# Patient Record
Sex: Male | Born: 1945 | Race: Black or African American | Hispanic: No | Smoking: Former smoker
Health system: Southern US, Community
[De-identification: ages and names within clinical notes are randomized; demographics above are authoritative.]

## PROBLEM LIST (undated history)

## (undated) DIAGNOSIS — G589 Mononeuropathy, unspecified: Secondary | ICD-10-CM

## (undated) DIAGNOSIS — I10 Essential (primary) hypertension: Secondary | ICD-10-CM

## (undated) DIAGNOSIS — M109 Gout, unspecified: Secondary | ICD-10-CM

## (undated) DIAGNOSIS — M1990 Unspecified osteoarthritis, unspecified site: Secondary | ICD-10-CM

## (undated) DIAGNOSIS — K5792 Diverticulitis of intestine, part unspecified, without perforation or abscess without bleeding: Secondary | ICD-10-CM

## (undated) DIAGNOSIS — I639 Cerebral infarction, unspecified: Secondary | ICD-10-CM

## (undated) HISTORY — PX: REPLACEMENT TOTAL KNEE: SUR1224

## (undated) HISTORY — PX: COLON SURGERY: SHX602

---

## 2016-04-08 ENCOUNTER — Encounter (HOSPITAL_BASED_OUTPATIENT_CLINIC_OR_DEPARTMENT_OTHER): Payer: Self-pay | Admitting: Emergency Medicine

## 2016-04-08 ENCOUNTER — Emergency Department (HOSPITAL_BASED_OUTPATIENT_CLINIC_OR_DEPARTMENT_OTHER)
Admission: EM | Admit: 2016-04-08 | Discharge: 2016-04-08 | Disposition: A | Discharge status: Home / Self Care | Payer: Medicare Other | Attending: Emergency Medicine | Admitting: Emergency Medicine

## 2016-04-08 ENCOUNTER — Emergency Department (HOSPITAL_BASED_OUTPATIENT_CLINIC_OR_DEPARTMENT_OTHER): Payer: Medicare Other

## 2016-04-08 DIAGNOSIS — Z87891 Personal history of nicotine dependence: Secondary | ICD-10-CM | POA: Insufficient documentation

## 2016-04-08 DIAGNOSIS — Y929 Unspecified place or not applicable: Secondary | ICD-10-CM | POA: Diagnosis not present

## 2016-04-08 DIAGNOSIS — I10 Essential (primary) hypertension: Secondary | ICD-10-CM | POA: Insufficient documentation

## 2016-04-08 DIAGNOSIS — Z79899 Other long term (current) drug therapy: Secondary | ICD-10-CM | POA: Insufficient documentation

## 2016-04-08 DIAGNOSIS — Y999 Unspecified external cause status: Secondary | ICD-10-CM | POA: Diagnosis not present

## 2016-04-08 DIAGNOSIS — W19XXXA Unspecified fall, initial encounter: Secondary | ICD-10-CM | POA: Insufficient documentation

## 2016-04-08 DIAGNOSIS — S52125A Nondisplaced fracture of head of left radius, initial encounter for closed fracture: Secondary | ICD-10-CM | POA: Insufficient documentation

## 2016-04-08 DIAGNOSIS — Y939 Activity, unspecified: Secondary | ICD-10-CM | POA: Insufficient documentation

## 2016-04-08 DIAGNOSIS — S6992XA Unspecified injury of left wrist, hand and finger(s), initial encounter: Secondary | ICD-10-CM | POA: Diagnosis present

## 2016-04-08 HISTORY — DX: Essential (primary) hypertension: I10

## 2016-04-08 HISTORY — DX: Mononeuropathy, unspecified: G58.9

## 2016-04-08 HISTORY — DX: Unspecified osteoarthritis, unspecified site: M19.90

## 2016-04-08 HISTORY — DX: Cerebral infarction, unspecified: I63.9

## 2016-04-08 HISTORY — DX: Diverticulitis of intestine, part unspecified, without perforation or abscess without bleeding: K57.92

## 2016-04-08 MED ORDER — HYDROCODONE-ACETAMINOPHEN 5-325 MG PO TABS: 1.0000 | ORAL_TABLET | Freq: Four times a day (QID) | ORAL | 0 refills | Status: DC | PRN

## 2016-04-08 MED ORDER — HYDROCODONE-ACETAMINOPHEN 5-325 MG PO TABS
1.0000 | ORAL_TABLET | Freq: Once | ORAL | Status: AC
  Administered 2016-04-08: 1 via ORAL
  Filled 2016-04-08: qty 1

## 2016-04-08 NOTE — ED Triage Notes (Signed)
Patient reports a fall over the weekend and is concerned his hand or wrist is now fractured. Patient has hx of stroke. Swelling noted to hand, family at bedside states the swelling is about the same as usual. Patient also c/o left shoulder pain.

## 2016-04-08 NOTE — ED Provider Notes (Signed)
[facility] Provider Note   CSN: [id number] Arrival date & time: 04/08/16  1553  By signing my name below, I, Emmanuella Mensah, attest that this documentation has been prepared under the direction and in the presence of BoeingChris Jasmeen Fritsch, PA-C. Electronically Signed: Angelene GiovanniEmmanuella Mensah, ED Scribe. 04/08/16. 5:38 PM.   History   Chief Complaint Chief Complaint  Patient presents with  . Fall    with left hand/wrist pain   HPI Comments: William Waller is a 70 y.o. male with a hx of hypertension and left side deficit s/p stroke who presents to the Emergency Department complaining of gradually worsening moderate pain to the dorsum of his left hand with swelling s/p fall that occurred 4 days ago. Pt reports associated left anterior shoulder pain, left elbow pain, and left foot swelling. He notes that his left hand has been swollen since his stroke and is unsure if the swelling has increased. No alleviating factors noted. Family member reports that pt fell but denies any LOC or head injuries. Pt has not tried any medications PTA. He has NKDA. He denies any fever, chills, nausea, vomiting, numbness/tingling, acute weakness since fall, or any other symptoms.   The history is provided by the patient. No language interpreter was used.    Past Medical History:  Diagnosis Date  . Arthritis   . Diverticulitis   . Hypertension   . Pinched nerve    in back  . Stroke [facility](HCC)    left side deficit    There are no active problems to display for this patient.   Past Surgical History:  Procedure Laterality Date  . COLON SURGERY    . REPLACEMENT TOTAL KNEE         Home Medications    Prior to Admission medications   Medication Sig Start Date End Date Taking? Authorizing Provider  atorvastatin (LIPITOR) 10 MG tablet Take by mouth daily.   Yes Historical Provider, MD  gabapentin (NEURONTIN) 100 MG capsule Take by mouth 3 (three) times daily.   Yes Historical Provider, MD  NIFEdipine  (PROCARDIA) 10 MG capsule Take by mouth.   Yes Historical Provider, MD    Family History History reviewed. No pertinent family history.  Social History Social History  Substance Use Topics  . Smoking status: Former Smoker    Types: Cigarettes  . Smokeless tobacco: Never Used  . Alcohol use No     Allergies   Patient has no known allergies.   Review of Systems Review of Systems  A complete 10 system review of systems was obtained and all systems are negative except as noted in the HPI and PMH.   Physical Exam Updated Vital Signs BP 131/84 (BP Location: Right Arm)   Pulse 71   Temp 98.1 F (36.7 C) (Oral)   Resp 18   Ht 5\' 9"  (1.753 m)   Wt 210 lb (95.3 kg)   SpO2 99%   BMI 31.01 kg/m   Physical Exam  Constitutional: He is oriented to person, place, and time. He appears well-developed and well-nourished.  HENT:  Head: Normocephalic and atraumatic.  Pulmonary/Chest: Effort normal.  Musculoskeletal: He exhibits tenderness.  Diffuse pain over dorsum of left hand Pain diffusely in elbow and over left lateral shoulder  ROM decreased due to his stroke; unable to test Good pulses   Neurological: He is alert and oriented to person, place, and time.  Skin: Skin is warm and dry.  Psychiatric: He has a normal mood and affect.  Nursing  note and vitals reviewed.    ED Treatments / Results  DIAGNOSTIC STUDIES: Oxygen Saturation is 99% on RA, normal by my interpretation.    COORDINATION OF CARE: 5:33 PM- Pt advised of plan for treatment and pt agrees. Pt will receive left hand, left elbow, left shoulder, and left foot x-rays for further evaluation.    Labs (all labs ordered are listed, but only abnormal results are displayed) Labs Reviewed - No data to display  EKG  EKG Interpretation None       Radiology No results found.  Procedures Procedures (including critical care time)  Medications Ordered in ED Medications - No data to display   Initial  Impression / Assessment and Plan / ED Course  Ebbie Ridgehris Aribelle Mccosh, PA-C has reviewed the triage vital signs and the nursing notes.  Pertinent labs & imaging results that were available during my care of the patient were reviewed by me and considered in my medical decision making (see chart for details).  Clinical Course      Patient with left shoulder, left elbow, and left hand pain. Presentation concerning for sprain. X-rays reviewed by me revealed no bony abnormality, dislocation, or fracture. Patient is neurovascularly intact distally and able to move left shoulder, left elbow, and left hand although states it is painful. . Discussed RICE and pain medication. Instructed patient to follow up with their primary care provider within 2-3 days to have eft shoulder, left elbow, and left hand reevaluated. Discussed strict return precautions to the ED. patient appears reliable and expressed understanding to the discharge instructions.    Final Clinical Impressions(s) / ED Diagnoses   Final diagnoses:  None    New Prescriptions New Prescriptions   No medications on file   I personally performed the services described in this documentation, which was scribed in my presence. The recorded information has been reviewed and is accurate.   Charlestine NightChristopher Carlous Olivares, PA-C 04/10/16 0158    Maia PlanJoshua G Long, MD 04/10/16 [phone]

## 2016-04-08 NOTE — Discharge Instructions (Signed)
Return here as needed.  Follow-up with the orthopedist provided.  Ice to the area that is painful

## 2016-07-17 ENCOUNTER — Other Ambulatory Visit: Payer: Self-pay | Admitting: *Deleted

## 2016-07-17 ENCOUNTER — Ambulatory Visit (INDEPENDENT_AMBULATORY_CARE_PROVIDER_SITE_OTHER): Payer: Medicare Other | Admitting: Podiatry

## 2016-07-17 ENCOUNTER — Encounter: Payer: Self-pay | Admitting: Podiatry

## 2016-07-17 VITALS — Ht 69.0 in | Wt 210.0 lb

## 2016-07-17 DIAGNOSIS — L600 Ingrowing nail: Secondary | ICD-10-CM | POA: Diagnosis not present

## 2016-07-17 DIAGNOSIS — B351 Tinea unguium: Secondary | ICD-10-CM

## 2016-07-17 DIAGNOSIS — M79671 Pain in right foot: Secondary | ICD-10-CM

## 2016-07-17 DIAGNOSIS — M79672 Pain in left foot: Secondary | ICD-10-CM

## 2016-07-17 NOTE — Progress Notes (Signed)
SUBJECTIVE: 71 y.o. year old male presents complaining of painful toe nails duration of few days. Patient is wheelchair bound and escorted by his sister. Positive history of Stroke in August 2017. Weak on left side. Pinch nerve in back duration of 4-5 month.   REVIEW OF SYSTEMS: A comprehensive review of systems was negative except for: Recent history of stroke with left side weakness, pinch nerve on back for 4-5 months.  OBJECTIVE: DERMATOLOGIC EXAMINATION: Thick dystrophic nails x 10. Positive of yellow grey fungal debris in all nails. Severely deformed nail with ingrown nail right hallux.  VASCULAR EXAMINATION OF LOWER LIMBS: Pedal pulses are not palpable. Pedal edema bilateral.   NEUROLOGIC EXAMINATION OF THE LOWER LIMBS: All epicritic and tactile sensations are compromized.  MUSCULOSKELETAL EXAMINATION: Positive for stiffness of lower foot and ankle joint bilateral L>R.  ASSESSMENT: Onychomycosis x 10. Ingrown right hallucal nail without open skin. Pain in both feet.  PLAN: Reviewed findings and available options. All nails debrided. May return in 3 months.

## 2016-07-17 NOTE — Patient Instructions (Signed)
Seen for hypertrophic nails. All nails debrided. Return in 3 months or as needed.  

## 2016-10-21 ENCOUNTER — Ambulatory Visit (INDEPENDENT_AMBULATORY_CARE_PROVIDER_SITE_OTHER): Payer: Medicare Other | Admitting: Podiatry

## 2016-10-21 DIAGNOSIS — L600 Ingrowing nail: Secondary | ICD-10-CM | POA: Diagnosis not present

## 2016-10-21 DIAGNOSIS — M79672 Pain in left foot: Secondary | ICD-10-CM

## 2016-10-21 DIAGNOSIS — M79671 Pain in right foot: Secondary | ICD-10-CM | POA: Diagnosis not present

## 2016-10-21 DIAGNOSIS — B351 Tinea unguium: Secondary | ICD-10-CM | POA: Diagnosis not present

## 2016-10-21 NOTE — Progress Notes (Signed)
SUBJECTIVE: 71 y.o. year old male presents complaining of painful toe nails duration of few days. Patient is wheelchair bound and escorted by his sister. Positive history of Stroke in August 2017. Weak on left side. Pinch nerve in back duration of 4-5 month.   REVIEW OF SYSTEMS: A comprehensive review of systems was negative except for: Recent history of stroke with left side weakness, pinch nerve on back for 4-5 months.  OBJECTIVE: DERMATOLOGIC EXAMINATION: Thick dystrophic nails x 10. Positive of yellow grey fungal debris in all nails. Severely deformed nail with ingrown nail right hallux.  VASCULAR EXAMINATION OF LOWER LIMBS: Pedal pulses are not palpable. Pedal edema bilateral.   NEUROLOGIC EXAMINATION OF THE LOWER LIMBS: All epicritic and tactile sensations are compromized.  MUSCULOSKELETAL EXAMINATION: Positive for stiffness of lower foot and ankle joint bilateral L>R.  ASSESSMENT: Onychomycosis x 10. Ingrown right hallucal nail without open skin. Pain in both feet.  PLAN: Reviewed findings and available options. All nails debrided. May return in 3 months.

## 2016-10-21 NOTE — Patient Instructions (Addendum)
Seen for hypertrophic nails. All nails debrided. Return in 3 months or as needed.  

## 2016-10-22 ENCOUNTER — Encounter: Payer: Self-pay | Admitting: Podiatry

## 2017-01-21 ENCOUNTER — Ambulatory Visit: Payer: Medicare Other | Admitting: Podiatry

## 2017-02-04 ENCOUNTER — Encounter: Payer: Self-pay | Admitting: Podiatry

## 2017-02-04 ENCOUNTER — Ambulatory Visit (INDEPENDENT_AMBULATORY_CARE_PROVIDER_SITE_OTHER): Payer: Medicare Other | Admitting: Podiatry

## 2017-02-04 DIAGNOSIS — B353 Tinea pedis: Secondary | ICD-10-CM

## 2017-02-04 DIAGNOSIS — B351 Tinea unguium: Secondary | ICD-10-CM

## 2017-02-04 MED ORDER — NYSTATIN 100000 UNIT/GM EX CREA: TOPICAL_CREAM | Freq: Two times a day (BID) | CUTANEOUS | 3 refills | Status: DC

## 2017-02-04 NOTE — Patient Instructions (Signed)
Seen for hypertrophic nails. All nails debrided. Noted of skin lesion from fungus. Nystatin cream prescribed with instruction. Use anti fungal powder during the day. Return in 3 months or as needed.

## 2017-02-04 NOTE — Progress Notes (Signed)
SUBJECTIVE: 71 y.o.year old malepresents requesting nails trimmed. He is wheelchair bound and escorted by his sister.   HPI: Positive history of Stroke in August 2017. Weak on left side and on brace in left lower limb. Pinch nerve in back duration of 4-5 month.  OBJECTIVE: DERMATOLOGIC EXAMINATION: Thick dystrophic nails x 10. Positive of yellow grey fungal debris in all nails. Dry erupting skin in 1st web space and dorsal aspect left foot.  VASCULAR EXAMINATION OF LOWER LIMBS: Pedal pulses are not palpable. Pedal edema bilateral.   NEUROLOGIC EXAMINATION OF THE LOWER LIMBS: All epicritic and tactile sensations are compromized.  MUSCULOSKELETAL EXAMINATION: Positive for stiffness of lower foot and ankle joint bilateral L>R.  ASSESSMENT: Onychomycosis x 10. Tinea pedis left foot. Pain in both feet.  PLAN: Reviewed findings and available options. Rx. Nystatin cream. Instructed to use anti fungal powder during the day. All nails debrided. May return in 3 months

## 2017-04-29 ENCOUNTER — Other Ambulatory Visit: Payer: Self-pay

## 2017-04-29 ENCOUNTER — Emergency Department (HOSPITAL_BASED_OUTPATIENT_CLINIC_OR_DEPARTMENT_OTHER): Payer: Medicare Other

## 2017-04-29 ENCOUNTER — Encounter (HOSPITAL_BASED_OUTPATIENT_CLINIC_OR_DEPARTMENT_OTHER): Payer: Self-pay

## 2017-04-29 ENCOUNTER — Inpatient Hospital Stay (HOSPITAL_BASED_OUTPATIENT_CLINIC_OR_DEPARTMENT_OTHER)
Admission: EM | Admit: 2017-04-29 | Discharge: 2017-05-01 | DRG: 176 | Disposition: A | Discharge status: Home Health | Payer: Medicare Other | Attending: Family Medicine | Admitting: Family Medicine

## 2017-04-29 DIAGNOSIS — R0600 Dyspnea, unspecified: Secondary | ICD-10-CM | POA: Diagnosis not present

## 2017-04-29 DIAGNOSIS — I2699 Other pulmonary embolism without acute cor pulmonale: Secondary | ICD-10-CM | POA: Diagnosis present

## 2017-04-29 DIAGNOSIS — Z6833 Body mass index (BMI) 33.0-33.9, adult: Secondary | ICD-10-CM | POA: Diagnosis not present

## 2017-04-29 DIAGNOSIS — Z993 Dependence on wheelchair: Secondary | ICD-10-CM

## 2017-04-29 DIAGNOSIS — E669 Obesity, unspecified: Secondary | ICD-10-CM | POA: Diagnosis present

## 2017-04-29 DIAGNOSIS — Z720 Tobacco use: Secondary | ICD-10-CM | POA: Diagnosis not present

## 2017-04-29 DIAGNOSIS — E039 Hypothyroidism, unspecified: Secondary | ICD-10-CM | POA: Diagnosis present

## 2017-04-29 DIAGNOSIS — M1990 Unspecified osteoarthritis, unspecified site: Secondary | ICD-10-CM | POA: Diagnosis present

## 2017-04-29 DIAGNOSIS — M109 Gout, unspecified: Secondary | ICD-10-CM | POA: Diagnosis present

## 2017-04-29 DIAGNOSIS — I69354 Hemiplegia and hemiparesis following cerebral infarction affecting left non-dominant side: Secondary | ICD-10-CM | POA: Diagnosis not present

## 2017-04-29 DIAGNOSIS — E119 Type 2 diabetes mellitus without complications: Secondary | ICD-10-CM

## 2017-04-29 DIAGNOSIS — E1165 Type 2 diabetes mellitus with hyperglycemia: Secondary | ICD-10-CM | POA: Diagnosis present

## 2017-04-29 DIAGNOSIS — R402414 Glasgow coma scale score 13-15, 24 hours or more after hospital admission: Secondary | ICD-10-CM | POA: Diagnosis not present

## 2017-04-29 DIAGNOSIS — R062 Wheezing: Secondary | ICD-10-CM

## 2017-04-29 DIAGNOSIS — D649 Anemia, unspecified: Secondary | ICD-10-CM | POA: Diagnosis present

## 2017-04-29 DIAGNOSIS — R778 Other specified abnormalities of plasma proteins: Secondary | ICD-10-CM | POA: Diagnosis present

## 2017-04-29 DIAGNOSIS — I517 Cardiomegaly: Secondary | ICD-10-CM | POA: Diagnosis present

## 2017-04-29 DIAGNOSIS — R7989 Other specified abnormal findings of blood chemistry: Secondary | ICD-10-CM | POA: Diagnosis present

## 2017-04-29 DIAGNOSIS — Z96659 Presence of unspecified artificial knee joint: Secondary | ICD-10-CM | POA: Diagnosis present

## 2017-04-29 DIAGNOSIS — R0602 Shortness of breath: Secondary | ICD-10-CM | POA: Diagnosis present

## 2017-04-29 DIAGNOSIS — J42 Unspecified chronic bronchitis: Secondary | ICD-10-CM | POA: Diagnosis present

## 2017-04-29 DIAGNOSIS — M791 Myalgia, unspecified site: Secondary | ICD-10-CM | POA: Diagnosis present

## 2017-04-29 DIAGNOSIS — Z79899 Other long term (current) drug therapy: Secondary | ICD-10-CM | POA: Diagnosis not present

## 2017-04-29 DIAGNOSIS — I6930 Unspecified sequelae of cerebral infarction: Secondary | ICD-10-CM | POA: Diagnosis not present

## 2017-04-29 DIAGNOSIS — Z791 Long term (current) use of non-steroidal anti-inflammatories (NSAID): Secondary | ICD-10-CM | POA: Diagnosis not present

## 2017-04-29 DIAGNOSIS — I119 Hypertensive heart disease without heart failure: Secondary | ICD-10-CM | POA: Diagnosis present

## 2017-04-29 DIAGNOSIS — R748 Abnormal levels of other serum enzymes: Secondary | ICD-10-CM | POA: Diagnosis not present

## 2017-04-29 DIAGNOSIS — J410 Simple chronic bronchitis: Secondary | ICD-10-CM | POA: Diagnosis not present

## 2017-04-29 DIAGNOSIS — E113559 Type 2 diabetes mellitus with stable proliferative diabetic retinopathy, unspecified eye: Secondary | ICD-10-CM | POA: Diagnosis not present

## 2017-04-29 DIAGNOSIS — I2510 Atherosclerotic heart disease of native coronary artery without angina pectoris: Secondary | ICD-10-CM | POA: Diagnosis present

## 2017-04-29 DIAGNOSIS — R000 Tachycardia, unspecified: Secondary | ICD-10-CM | POA: Diagnosis present

## 2017-04-29 HISTORY — DX: Gout, unspecified: M10.9

## 2017-04-29 LAB — CBC WITH DIFFERENTIAL/PLATELET
BASOS PCT: 0 %
Basophils Absolute: 0 10*3/uL (ref 0.0–0.1)
EOS ABS: 0.3 10*3/uL (ref 0.0–0.7)
EOS PCT: 3 %
HCT: 37.3 % — ABNORMAL LOW (ref 39.0–52.0)
Hemoglobin: 12.5 g/dL — ABNORMAL LOW (ref 13.0–17.0)
LYMPHS ABS: 2.2 10*3/uL (ref 0.7–4.0)
Lymphocytes Relative: 23 %
MCH: 29.4 pg (ref 26.0–34.0)
MCHC: 33.5 g/dL (ref 30.0–36.0)
MCV: 87.8 fL (ref 78.0–100.0)
MONO ABS: 0.9 10*3/uL (ref 0.1–1.0)
MONOS PCT: 10 %
Neutro Abs: 6.1 10*3/uL (ref 1.7–7.7)
Neutrophils Relative %: 64 %
PLATELETS: 207 10*3/uL (ref 150–400)
RBC: 4.25 MIL/uL (ref 4.22–5.81)
RDW: 14.2 % (ref 11.5–15.5)
WBC: 9.5 10*3/uL (ref 4.0–10.5)

## 2017-04-29 LAB — BASIC METABOLIC PANEL
Anion gap: 7 (ref 5–15)
BUN: 11 mg/dL (ref 6–20)
CALCIUM: 9.5 mg/dL (ref 8.9–10.3)
CHLORIDE: 101 mmol/L (ref 101–111)
CO2: 27 mmol/L (ref 22–32)
CREATININE: 1.12 mg/dL (ref 0.61–1.24)
Glucose, Bld: 193 mg/dL — ABNORMAL HIGH (ref 65–99)
Potassium: 3.7 mmol/L (ref 3.5–5.1)
SODIUM: 135 mmol/L (ref 135–145)

## 2017-04-29 LAB — TROPONIN I: TROPONIN I: 0.05 ng/mL — AB (ref <0.03)

## 2017-04-29 LAB — D-DIMER, QUANTITATIVE: D-Dimer, Quant: 1.2 ug/mL-FEU — ABNORMAL HIGH (ref 0.00–0.50)

## 2017-04-29 MED ORDER — LEVALBUTEROL HCL 0.63 MG/3ML IN NEBU
INHALATION_SOLUTION | RESPIRATORY_TRACT | Status: AC
  Administered 2017-04-29: 0.63 mg
  Filled 2017-04-29: qty 3

## 2017-04-29 MED ORDER — ACETAMINOPHEN 650 MG RE SUPP: 650.0000 mg | Freq: Four times a day (QID) | RECTAL | Status: DC | PRN

## 2017-04-29 MED ORDER — ONDANSETRON HCL 4 MG PO TABS: 4.0000 mg | ORAL_TABLET | Freq: Four times a day (QID) | ORAL | Status: DC | PRN

## 2017-04-29 MED ORDER — IOPAMIDOL (ISOVUE-370) INJECTION 76%
100.0000 mL | Freq: Once | INTRAVENOUS | Status: AC | PRN
  Administered 2017-04-29: 100 mL via INTRAVENOUS

## 2017-04-29 MED ORDER — ACETAMINOPHEN 325 MG PO TABS: 650.0000 mg | ORAL_TABLET | Freq: Four times a day (QID) | ORAL | Status: DC | PRN

## 2017-04-29 MED ORDER — HEPARIN BOLUS VIA INFUSION
5500.0000 [IU] | Freq: Once | INTRAVENOUS | Status: AC
  Administered 2017-04-29: 5500 [IU] via INTRAVENOUS

## 2017-04-29 MED ORDER — PREDNISONE 20 MG PO TABS
40.0000 mg | ORAL_TABLET | Freq: Once | ORAL | Status: AC
  Administered 2017-04-29: 40 mg via ORAL
  Filled 2017-04-29: qty 2

## 2017-04-29 MED ORDER — ALBUTEROL (5 MG/ML) CONTINUOUS INHALATION SOLN
INHALATION_SOLUTION | RESPIRATORY_TRACT | Status: AC
  Administered 2017-04-29: 2.5 mg
  Filled 2017-04-29: qty 0.5

## 2017-04-29 MED ORDER — IPRATROPIUM-ALBUTEROL 0.5-2.5 (3) MG/3ML IN SOLN
RESPIRATORY_TRACT | Status: AC
  Administered 2017-04-29: 3 mL
  Filled 2017-04-29: qty 3

## 2017-04-29 MED ORDER — ALBUTEROL (5 MG/ML) CONTINUOUS INHALATION SOLN
INHALATION_SOLUTION | RESPIRATORY_TRACT | Status: AC
Start: 2017-04-29 — End: 2017-04-29
  Administered 2017-04-29: 2.5 mg
  Filled 2017-04-29: qty 0.5

## 2017-04-29 MED ORDER — HEPARIN (PORCINE) IN NACL 100-0.45 UNIT/ML-% IJ SOLN
1600.0000 [IU]/h | INTRAMUSCULAR | Status: DC
  Administered 2017-04-29 – 2017-04-30 (×2): 1600 [IU]/h via INTRAVENOUS
  Filled 2017-04-29 (×2): qty 250

## 2017-04-29 MED ORDER — IPRATROPIUM BROMIDE 0.02 % IN SOLN
RESPIRATORY_TRACT | Status: AC
  Administered 2017-04-29: 0.5 mg via RESPIRATORY_TRACT
  Filled 2017-04-29: qty 2.5

## 2017-04-29 MED ORDER — LEVALBUTEROL HCL 0.63 MG/3ML IN NEBU: 0.6300 mg | INHALATION_SOLUTION | Freq: Four times a day (QID) | RESPIRATORY_TRACT | Status: DC | PRN

## 2017-04-29 MED ORDER — LEVALBUTEROL HCL 1.25 MG/0.5ML IN NEBU
1.2500 mg | INHALATION_SOLUTION | Freq: Three times a day (TID) | RESPIRATORY_TRACT | Status: DC
  Administered 2017-04-29 – 2017-05-01 (×6): 1.25 mg via RESPIRATORY_TRACT
  Filled 2017-04-29 (×6): qty 0.5

## 2017-04-29 MED ORDER — ALBUTEROL SULFATE (2.5 MG/3ML) 0.083% IN NEBU
INHALATION_SOLUTION | RESPIRATORY_TRACT | Status: AC
  Administered 2017-04-29: 2.5 mg
  Filled 2017-04-29: qty 3

## 2017-04-29 MED ORDER — IPRATROPIUM BROMIDE 0.02 % IN SOLN
0.5000 mg | Freq: Three times a day (TID) | RESPIRATORY_TRACT | Status: DC
  Administered 2017-04-29 – 2017-05-01 (×6): 0.5 mg via RESPIRATORY_TRACT
  Filled 2017-04-29 (×6): qty 2.5

## 2017-04-29 MED ORDER — ONDANSETRON HCL 4 MG/2ML IJ SOLN: 4.0000 mg | Freq: Four times a day (QID) | INTRAMUSCULAR | Status: DC | PRN

## 2017-04-29 NOTE — Progress Notes (Signed)
ANTICOAGULATION CONSULT NOTE - Initial Consult  Pharmacy Consult for heparin Indication: pulmonary embolus  No Known Allergies  Patient Measurements: Height: 5\' 9"  (175.3 cm) Weight: 225 lb (102.1 kg) IBW/kg (Calculated) : 70.7 Heparin Dosing Weight: 92.5 kg  Assessment: 71 yo M presents on 12/12 with flu like symptoms. Found to have PE. Pharmacy consulted for heparin. Hgb 12.5, plts wnl.  Goal of Therapy:  Heparin level 0.3-0.7 units/ml Monitor platelets by anticoagulation protocol: Yes   Plan:  Give heparin 5,500 unit bolus Start heparin gtt at 1,600 units/hr Monitor daily heparin level, CBC, s/s of bleed  William Waller, PharmD, BCPS Clinical Pharmacist Pager [phone] 04/29/2017 4:14 PM

## 2017-04-29 NOTE — ED Notes (Addendum)
Date and time results received: 04/29/17 14:56 (use smartphrase ".now" to insert current time)  Test: trp Critical Value: 0.05  Name of Provider Notified: Gunadasa Orders Received? Or Actions Taken?: no orders given

## 2017-04-29 NOTE — ED Triage Notes (Signed)
C/o flu like sx x 3 days-NAD-presents to triage in w/c

## 2017-04-29 NOTE — ED Provider Notes (Signed)
[facility] EMERGENCY DEPARTMENT Provider Note   CSN: [id number] Arrival date & time: 04/29/17  1304     History   Chief Complaint Chief Complaint  Patient presents with  . Cough    HPI William Waller is a 71 y.o. male.  HPI 71 yo male with PMH of HTN, hx stroke with left sided deficits, diverticulosis, gout who presents with 3-4 days of chest congestion, wheezing, and cough with productive white mucous. No history of asthma or COPD but reports of tobacco use for the past 51 years; quit 2 years ago. His sister reports he has never had a episode of wheezing in the past. Reports of slight shortness of breath and constant central chest tightness. Denies current chest tightness.  Uncertain of aggravating factors but does report that the chest tightness is worse at night. No diaphoresis or nausea. Reports of chronic orthopnea that has not worsened. Has chronic lower extremity edema with left greater than right which is chronic. The left lower extremity swelling has been greater since his stroke two years ago. Family reports he ambulates short distances with a cane but needs wheelchair for longer distances. Reports that the breathing treatment received in the ED only slightly helped.   Past Medical History:  Diagnosis Date  . Arthritis   . Diverticulitis   . Gout   . Hypertension   . Pinched nerve    in back  . Stroke [facility](HCC)    left side deficit    There are no active problems to display for this patient.   Past Surgical History:  Procedure Laterality Date  . COLON SURGERY    . REPLACEMENT TOTAL KNEE         Home Medications    Prior to Admission medications   Medication Sig Start Date End Date Taking? Authorizing Provider  Levothyroxine Sodium (SYNTHROID PO) Take by mouth.   Yes [provider]  acetaminophen (TYLENOL) 325 MG tablet Take 650 mg by mouth. 02/04/16   [provider]  allopurinol (ZYLOPRIM) 100 MG tablet  06/11/16   [provider]  colchicine 0.6 MG tablet Take 0.6 mg by mouth. 09/02/10   [provider]  gabapentin (NEURONTIN) 100 MG capsule TK 1 TO 3 CS PO HS PRF LEG PAIN 10/09/15   [provider]  hydrOXYzine (ATARAX/VISTARIL) 25 MG tablet  07/11/16   [provider]  latanoprost (XALATAN) 0.005 % ophthalmic solution  06/11/16   [provider]  losartan (COZAAR) 50 MG tablet TK 1 T PO  DAILY 09/10/15   [provider]  meloxicam (MOBIC) 7.5 MG tablet  06/27/15   [provider]  nystatin cream (MYCOSTATIN) Apply topically 2 (two) times daily. 02/04/17   Sheard, Myeong O, DPM  pantoprazole (PROTONIX) 40 MG tablet  02/25/16   [provider]  polyethylene glycol powder (GLYCOLAX/MIRALAX) powder  02/25/16   [provider]  predniSONE (STERAPRED UNI-PAK 21 TAB) 10 MG (21) TBPK tablet TK UTD 10/09/15   [provider]  tamsulosin (FLOMAX) 0.4 MG CAPS capsule  06/20/16   [provider]  traMADol (ULTRAM) 50 MG tablet TK 1 T PO  Q 6 HOURS PRN P 06/07/15   [provider]  Travoprost, BAK Free, (TRAVATAN Z) 0.004 % SOLN ophthalmic solution Place 1 drop into both eyes nightly. 03/03/16   [provider]  traZODone (DESYREL) 100 MG tablet  02/25/16   [provider]  Triamcinolone Acetonide 0.025 % LOTN  06/13/16  [provider]    Family History No family history on file.  Social History Social History   Tobacco Use  . Smoking status: Former Smoker    Types: Cigarettes  . Smokeless tobacco: Never Used  Substance Use Topics  . Alcohol use: No  . Drug use: No     Allergies   Patient has no known allergies.   Review of Systems Review of Systems  Constitutional: Positive for chills. Negative for fever.  HENT: Positive for sore throat. Negative for postnasal drip, rhinorrhea, sinus pressure and trouble swallowing.   Respiratory: Positive for cough, chest tightness, shortness of  breath and wheezing.   Cardiovascular: Positive for leg swelling. Negative for palpitations.  Gastrointestinal: Negative for abdominal pain, constipation, diarrhea, nausea and vomiting.     Physical Exam Updated Vital Signs BP (!) 155/84 (BP Location: Left Arm)   Pulse (!) 108   Temp 98.5 F (36.9 C) (Oral)   Resp 20   Ht 5\' 9"  (1.753 m)   Wt 102.1 kg (225 lb)   SpO2 98%   BMI 33.23 kg/m   Physical Exam  Constitutional: He is oriented to person, place, and time. He appears well-developed and well-nourished. No distress.  HENT:  Mouth/Throat: Oropharynx is clear and moist.  Eyes: Conjunctivae are normal. Pupils are equal, round, and reactive to light. Right eye exhibits no discharge. Left eye exhibits no discharge.  Neck: Neck supple. No JVD present.  Cardiovascular: Normal rate and regular rhythm.  No murmur heard. Pulmonary/Chest: He has wheezes. He has no rales.  Tachypnea noted. Mild increased work of breathing.   Musculoskeletal: He exhibits no edema (left lower extremity pitting edema).  Neurological: He is alert and oriented to person, place, and time. No cranial nerve deficit.  Chronic left lower extremity weakness.   Skin: Skin is warm and dry. Capillary refill takes less than 2 seconds. He is not diaphoretic.  Psychiatric: He has a normal mood and affect. His behavior is normal. Judgment and thought content normal.     ED Treatments / Results  Labs (all labs ordered are listed, but only abnormal results are displayed) Labs Reviewed  CBC WITH DIFFERENTIAL/PLATELET - Abnormal; Notable for the following components:      Result Value   Hemoglobin 12.5 (*)    HCT 37.3 (*)    All other components within normal limits  BASIC METABOLIC PANEL - Abnormal; Notable for the following components:   Glucose, Bld 193 (*)    All other components within normal limits  TROPONIN I - Abnormal; Notable for the following components:   Troponin I 0.05 (*)    All other components  within normal limits  D-DIMER, QUANTITATIVE (NOT AT [facility]) - Abnormal; Notable for the following components:   D-Dimer, Quant 1.20 (*)    All other components within normal limits  HEPARIN LEVEL (UNFRACTIONATED)    EKG  EKG Interpretation  Date/Time:  Wednesday April 29 2017 14:05:08 EST Ventricular Rate:  110 PR Interval:    QRS Duration: 110 QT Interval:  359 QTC Calculation: 486 R Axis:   -25 Text Interpretation:  Sinus tachycardia Paired ventricular premature complexes Aberrant conduction of SV complex(es) Borderline left axis deviation Abnormal R-wave progression, early transition Nonspecific repolarization abnormalities Borderline prolonged QT interval Baseline wander in lead(s) V2 sinus with frequent pacs Confirmed by Benjiman CorePickering, Nathan [phone]([id number]) on 04/29/2017 3:23:54 PM       Radiology Dg Chest 2 View  Result Date: 04/29/2017 CLINICAL DATA:  Cough, wheeze, and  shortness of breath EXAM: CHEST  2 VIEW COMPARISON:  None. FINDINGS: Elevated right diaphragm with eventration appearance in the lateral projection. Borderline heart size. Aortic tortuosity. There is no edema, consolidation, effusion, or pneumothorax. No acute osseous finding. IMPRESSION: 1. No acute finding. 2. Elevated right diaphragm that is likely from eventration. Electronically Signed   By: Marnee Spring M.D.   On: 04/29/2017 13:48   Ct Angio Chest Pe W And/or Wo Contrast  Result Date: 04/29/2017 CLINICAL DATA:  Shortness of breath. Flu-like symptoms for 3 days. Positive D-dimer. Unable to lift arm. EXAM: CT ANGIOGRAPHY CHEST WITH CONTRAST TECHNIQUE: Multidetector CT imaging of the chest was performed using the standard protocol during bolus administration of intravenous contrast. Multiplanar CT image reconstructions and MIPs were obtained to evaluate the vascular anatomy. CONTRAST:  ISOVUE-370 IOPAMIDOL (ISOVUE-370) INJECTION 76% COMPARISON:  Chest radiograph earlier today. FINDINGS: Cardiovascular:  Cardiomegaly. Three-vessel coronary artery calcification. Calcification of the thoracic aorta and its major branches, without visible dissection. There is satisfactory opacification of the pulmonary arteries to the segmental level. There is no central or proximal pulmonary embolus. Visualization of the subsegmental pulmonary arterial branches is less optimal due to respiratory motion and positioning. However there is concern for a RIGHT posterior segment lower lobe subsegmental pulmonary embolus as seen on series 10, image 143. Mediastinum/Nodes: No enlarged mediastinal, hilar, or axillary lymph nodes. Thyroid gland, trachea, and esophagus demonstrate no significant findings. Lungs/Pleura: Lungs are clear. No pleural effusion or pneumothorax. Upper Abdomen: RIGHT hemidiaphragm elevation. Musculoskeletal: No chest wall abnormality. No acute or significant osseous findings. Review of the MIP images confirms the above findings. IMPRESSION: No central or segmental pulmonary embolus. However, there is concern for a RIGHT posterior segment, lower lobe subsegmental pulmonary embolus. See discussion above. Cardiomegaly with 3 vessel coronary artery calcification. Aortic Atherosclerosis (ICD10-I70.0). Electronically Signed   By: Elsie Stain M.D.   On: 04/29/2017 15:55    Procedures Procedures (including critical care time)  Medications Ordered in ED Medications  heparin ADULT infusion 100 units/mL (25000 units/258mL sodium chloride 0.45%) (not administered)  albuterol (PROVENTIL, VENTOLIN) (5 MG/ML) 0.5% continuous inhalation solution (2.5 mg  Given 04/29/17 1329)  ipratropium-albuterol (DUONEB) 0.5-2.5 (3) MG/3ML nebulizer solution (3 mLs  Given 04/29/17 1329)  albuterol (PROVENTIL, VENTOLIN) (5 MG/ML) 0.5% continuous inhalation solution (2.5 mg  Given 04/29/17 1406)  albuterol (PROVENTIL) (2.5 MG/3ML) 0.083% nebulizer solution (2.5 mg  Given 04/29/17 1406)  iopamidol (ISOVUE-370) 76 % injection 100 mL (100  mLs Intravenous Contrast Given 04/29/17 1530)  predniSONE (DELTASONE) tablet 40 mg (40 mg Oral Given 04/29/17 1616)  heparin bolus via infusion 5,500 Units (5,500 Units Intravenous Bolus from Bag 04/29/17 1636)  levalbuterol (XOPENEX) 0.63 MG/3ML nebulizer solution (0.63 mg  Given 04/29/17 1618)  ipratropium (ATROVENT) 0.02 % nebulizer solution (0.5 mg  Given 04/29/17 1618)     Initial Impression / Assessment and Plan / ED Course  I have reviewed the triage vital signs and the nursing notes.  Pertinent labs & imaging results that were available during my care of the patient were reviewed by me and considered in my medical decision making (see chart for details).  71 yo male with PMH of HTN, hx stroke with left sided deficits, diverticulosis, gout who presents with 3-4 days of chest congestion, wheezing, cough with productive white mucous, central chest tightness. Received albuterol and atrovent while in triage. Initial vitals are stable but with tachypnea with respiratory rate to 24 but normal oxygen saturation on room air. CXR without  infiltrate. Obtained CBC, BMP, EKG, troponin I, and d-dimer. D-dimer obtained as he does have left lower extremity edema although family reports this is chronic and does use wheelchair for long distances.   Repeat duoneb around 2 pm given.   CBC with no leukocytosis. BMP unremarkable. EKG with normal sinus with PAS and nonspecific repolarization abnormalities. Troponin I is slightly elevated to 0.05. D-dimer elevated to 1.20.  He is intermittently tachycardic to one teens then self resolves to low 100s. This is after getting albuterol treatments. Will obtain CTA chest to evaluate for PE in the setting of elevated d dimer. CTA chest with "concern for a RIGHT posterior segment, lower lobe subsegmental pulmonary embolus" but reports less optimal visualization of the subsegmental branches due to respiratory motion and positioning. Will start on IV heparin.  Prednisone  40mg  given as well. He has diffuse wheezing on exam. No prior diagnosis of asthma or COPD. He may have undiagnosed COPD with flare. Repeat breathing treatment.   Consult hospitalist for further inpatient management. Signed out to ED provider Dr. Donnald Garre.    Final Clinical Impressions(s) / ED Diagnoses   Final diagnoses:  Shortness of breath  Wheezing    ED Discharge Orders    None       Palma Holter, MD 04/29/17 1641    Benjiman Core, MD 04/30/17 [phone]

## 2017-04-29 NOTE — Progress Notes (Addendum)
[facility]  Sob and Chest Tightness------ possible Pulm Emolism, Bronchitis-  CTA chest there is concern for a RIGHT posterior segment, lower lobe subsegmental pulmonary embolus. Troponin is elevated, echo pending  On iv Heparin- Tele bed requested

## 2017-04-30 ENCOUNTER — Inpatient Hospital Stay (HOSPITAL_COMMUNITY): Payer: Medicare Other

## 2017-04-30 DIAGNOSIS — R748 Abnormal levels of other serum enzymes: Secondary | ICD-10-CM

## 2017-04-30 DIAGNOSIS — R0600 Dyspnea, unspecified: Secondary | ICD-10-CM

## 2017-04-30 DIAGNOSIS — R7989 Other specified abnormal findings of blood chemistry: Secondary | ICD-10-CM

## 2017-04-30 DIAGNOSIS — I2699 Other pulmonary embolism without acute cor pulmonale: Principal | ICD-10-CM

## 2017-04-30 DIAGNOSIS — R778 Other specified abnormalities of plasma proteins: Secondary | ICD-10-CM | POA: Diagnosis present

## 2017-04-30 DIAGNOSIS — E039 Hypothyroidism, unspecified: Secondary | ICD-10-CM

## 2017-04-30 DIAGNOSIS — R0602 Shortness of breath: Secondary | ICD-10-CM

## 2017-04-30 DIAGNOSIS — R062 Wheezing: Secondary | ICD-10-CM

## 2017-04-30 DIAGNOSIS — D649 Anemia, unspecified: Secondary | ICD-10-CM | POA: Diagnosis present

## 2017-04-30 LAB — CBC
HEMATOCRIT: 38.8 % — AB (ref 39.0–52.0)
Hemoglobin: 12.7 g/dL — ABNORMAL LOW (ref 13.0–17.0)
MCH: 28.5 pg (ref 26.0–34.0)
MCHC: 32.7 g/dL (ref 30.0–36.0)
MCV: 87 fL (ref 78.0–100.0)
PLATELETS: 233 10*3/uL (ref 150–400)
RBC: 4.46 MIL/uL (ref 4.22–5.81)
RDW: 13.9 % (ref 11.5–15.5)
WBC: 10.7 10*3/uL — AB (ref 4.0–10.5)

## 2017-04-30 LAB — HEPARIN LEVEL (UNFRACTIONATED)
HEPARIN UNFRACTIONATED: 0.9 [IU]/mL — AB (ref 0.30–0.70)
HEPARIN UNFRACTIONATED: 0.12 [IU]/mL — AB (ref 0.30–0.70)

## 2017-04-30 LAB — HEMOGLOBIN A1C
Hgb A1c MFr Bld: 7.3 % — ABNORMAL HIGH (ref 4.8–5.6)
Mean Plasma Glucose: 162.81 mg/dL

## 2017-04-30 LAB — ECHOCARDIOGRAM COMPLETE
HEIGHTINCHES: 69 in
WEIGHTICAEL: 3608.49 [oz_av]

## 2017-04-30 LAB — TROPONIN I
TROPONIN I: 0.05 ng/mL — AB (ref <0.03)
TROPONIN I: 0.03 ng/mL — AB (ref <0.03)
Troponin I: 0.04 ng/mL (ref <0.03)

## 2017-04-30 LAB — GLUCOSE, CAPILLARY
GLUCOSE-CAPILLARY: 199 mg/dL — AB (ref 65–99)
Glucose-Capillary: 155 mg/dL — ABNORMAL HIGH (ref 65–99)
Glucose-Capillary: 194 mg/dL — ABNORMAL HIGH (ref 65–99)

## 2017-04-30 LAB — TSH: TSH: 1.408 u[IU]/mL (ref 0.350–4.500)

## 2017-04-30 MED ORDER — GABAPENTIN 100 MG PO CAPS
100.0000 mg | ORAL_CAPSULE | Freq: Every day | ORAL | Status: DC
  Administered 2017-04-30 (×2): 100 mg via ORAL
  Filled 2017-04-30 (×2): qty 1

## 2017-04-30 MED ORDER — ALLOPURINOL 100 MG PO TABS
100.0000 mg | ORAL_TABLET | Freq: Every day | ORAL | Status: DC
  Administered 2017-04-30 – 2017-05-01 (×2): 100 mg via ORAL
  Filled 2017-04-30 (×2): qty 1

## 2017-04-30 MED ORDER — INSULIN ASPART 100 UNIT/ML ~~LOC~~ SOLN: 0.0000 [IU] | Freq: Every day | SUBCUTANEOUS | Status: DC

## 2017-04-30 MED ORDER — COLCHICINE 0.6 MG PO TABS
0.6000 mg | ORAL_TABLET | Freq: Every day | ORAL | Status: DC
  Administered 2017-04-30 – 2017-05-01 (×2): 0.6 mg via ORAL
  Filled 2017-04-30 (×2): qty 1

## 2017-04-30 MED ORDER — CLONIDINE HCL 0.1 MG PO TABS
0.1000 mg | ORAL_TABLET | Freq: Two times a day (BID) | ORAL | Status: DC
  Administered 2017-04-30 (×3): 0.1 mg via ORAL
  Filled 2017-04-30 (×4): qty 1

## 2017-04-30 MED ORDER — LEVOTHYROXINE SODIUM 25 MCG PO TABS
25.0000 ug | ORAL_TABLET | Freq: Every day | ORAL | Status: DC
  Administered 2017-04-30 – 2017-05-01 (×2): 25 ug via ORAL
  Filled 2017-04-30 (×2): qty 1

## 2017-04-30 MED ORDER — TRAMADOL HCL 50 MG PO TABS: 50.0000 mg | ORAL_TABLET | Freq: Four times a day (QID) | ORAL | Status: DC | PRN

## 2017-04-30 MED ORDER — FUROSEMIDE 20 MG PO TABS
20.0000 mg | ORAL_TABLET | Freq: Every day | ORAL | Status: DC
  Administered 2017-04-30 – 2017-05-01 (×2): 20 mg via ORAL
  Filled 2017-04-30 (×2): qty 1

## 2017-04-30 MED ORDER — HYDROCODONE-ACETAMINOPHEN 10-325 MG PO TABS: 1.0000 | ORAL_TABLET | Freq: Three times a day (TID) | ORAL | Status: DC | PRN

## 2017-04-30 MED ORDER — PREDNISONE 20 MG PO TABS
40.0000 mg | ORAL_TABLET | Freq: Every day | ORAL | Status: DC
  Administered 2017-04-30 – 2017-05-01 (×2): 40 mg via ORAL
  Filled 2017-04-30 (×2): qty 2

## 2017-04-30 MED ORDER — LOSARTAN POTASSIUM 50 MG PO TABS
50.0000 mg | ORAL_TABLET | Freq: Every day | ORAL | Status: DC
  Administered 2017-04-30 – 2017-05-01 (×2): 50 mg via ORAL
  Filled 2017-04-30 (×4): qty 1

## 2017-04-30 MED ORDER — INSULIN ASPART 100 UNIT/ML ~~LOC~~ SOLN
0.0000 [IU] | Freq: Three times a day (TID) | SUBCUTANEOUS | Status: DC
  Administered 2017-04-30 (×2): 2 [IU] via SUBCUTANEOUS
  Administered 2017-05-01 (×2): 1 [IU] via SUBCUTANEOUS

## 2017-04-30 MED ORDER — TAMSULOSIN HCL 0.4 MG PO CAPS
0.4000 mg | ORAL_CAPSULE | Freq: Every day | ORAL | Status: DC
  Administered 2017-04-30: 0.4 mg via ORAL
  Filled 2017-04-30: qty 1

## 2017-04-30 MED ORDER — LATANOPROST 0.005 % OP SOLN
1.0000 [drp] | Freq: Every day | OPHTHALMIC | Status: DC
  Administered 2017-04-30 (×2): 1 [drp] via OPHTHALMIC
  Filled 2017-04-30: qty 2.5

## 2017-04-30 MED ORDER — ATORVASTATIN CALCIUM 40 MG PO TABS
40.0000 mg | ORAL_TABLET | Freq: Every day | ORAL | Status: DC
  Administered 2017-04-30 – 2017-05-01 (×2): 40 mg via ORAL
  Filled 2017-04-30 (×2): qty 1

## 2017-04-30 MED ORDER — HYDROXYZINE HCL 25 MG PO TABS
25.0000 mg | ORAL_TABLET | Freq: Four times a day (QID) | ORAL | Status: DC | PRN
  Administered 2017-04-30: 25 mg via ORAL
  Filled 2017-04-30: qty 1

## 2017-04-30 MED ORDER — HEPARIN (PORCINE) IN NACL 100-0.45 UNIT/ML-% IJ SOLN
1400.0000 [IU]/h | INTRAMUSCULAR | Status: DC
  Administered 2017-04-30 – 2017-05-01 (×2): 1400 [IU]/h via INTRAVENOUS
  Filled 2017-04-30: qty 250

## 2017-04-30 MED ORDER — SENNA 8.6 MG PO TABS
1.0000 | ORAL_TABLET | Freq: Every day | ORAL | Status: DC | PRN
  Administered 2017-04-30: 8.6 mg via ORAL
  Filled 2017-04-30: qty 1

## 2017-04-30 MED ORDER — SODIUM CHLORIDE 0.9% FLUSH: 10.0000 mL | INTRAVENOUS | Status: DC | PRN

## 2017-04-30 MED ORDER — TRAZODONE HCL 100 MG PO TABS
100.0000 mg | ORAL_TABLET | Freq: Every evening | ORAL | Status: DC | PRN
  Administered 2017-04-30 (×2): 100 mg via ORAL
  Filled 2017-04-30 (×2): qty 1

## 2017-04-30 NOTE — Progress Notes (Signed)
14 beats of V-tach.Patient awake ,verbally responsive.Denies chest discomfort.This is a second episodes of V-tach today.M.D.made aware.

## 2017-04-30 NOTE — Progress Notes (Signed)
PROGRESS NOTE    Patient: William Waller     PCP: [facility]                    DOB: March 27, 1946            DOA: 04/29/2017 [id number]             DOS: 04/30/2017, 12:28 PM   Date of Service: the patient was seen and examined on 04/30/2017 Subjective:  This morning still complaining of cough mild shortness of breath, on heparin drip Otherwise stable, no issues overnight   ----------------------------------------------------------------------------------------------------------------------  Brief Narrative:  William Waller is a 71 y.o. male with medical history significant of HTN CVA with left-sided residual weakness, wheelchair-bound, remote history of tobacco abuse and gout; who presents with complaints of a productive cough for the last 3-4 days.  Patient sister is his primary care giver at home.  Associated symptoms include wheezing, centralized chest tightness, chills, and worsening left lower extremity swelling.  At baseline, he is wheelchair-bound, but is able to help with transitioning.  Today however his sister noted that he complained of severe left lower extremity pain which was unusual for him and she noted that his left leg was bigger than his right.  Patient denies being around any recent sick contacts and patient reported being up-to-date on his flu vaccine.  Denies trying anything that worsened or relieved symptoms.  ED Course: Upon admission to the emergency department patient was seen to be afebrile, respirations 20-26, heart rates up to 116, blood pressures 135/74-166/94, and O2 saturations 90-98% on room air.  Labs revealed WBC 9.5, hemoglobin 12.5, glucose 193, d-dimer 1.2, and troponin 0.05.  CT angiogram of the chest showed signs of right posterior segment possible posterior pulmonary embolus and cardiomegaly with three-vessel coronary artery calcification.  Patient was given breathing treatments, 40 mg of prednisone, and started on heparin drip.   [facility] =====================================================================  Suspected pulmonary embolus:  Complaining of cough, mild shortness of breath  CT angiogram reveals signs of a pulmonary embolus.  Risk factors include decreased mobility.  Patient was started on heparin gtt. -Heparin drip, lower extremity Doppler studies were negative for any DVT, pending 2D echocardiogram Anticipating starting the patient on either Eliquis or Xarelto in a.m. - Continue heparin per pharmacy  -Pending  echocardiogram   Elevated troponin:  Not complaining of any chest pain, likely secondary to PE  -Trend cardiac troponins  Bronchitis due to tobacco use: Patient with remote history of tobacco use, but found to be acutely wheezing on physical exam.  Chest x-ray shows no clear signs of a pulmonary infiltrate. - Continue prednisone -DuoNeb bronchodilator treatments  Normocytic normochromic anemia: Hemoglobin 12.5 on admission appears stable and patient denies any complaints of bleeding. - Check CBC in a.m.  Hyperglycemia: Acute.  Initial glucose elevated at 193 on admission.  Elevation could be due to acute distress.  Check a hemoglobin A1c Initiated sliding scale insulin, Dissipating blood sugars to run high secondary to patient being on steroids - Continue to monitor   History of CVA -  with residual left-sided weakness: Stable-only on heparin drip, anticipating chronic anticoagulation  Essential hypertension - Continue losartan and clonidine  Hypothyroidism - Check TSH - Continue levothyroxine  DVT prophylaxis: Heparin Code Status: Full Family Communication: Discussed plan of care with the patient as well as his sister who acts as his caregiver by phone Disposition Plan: Likely  discharge home in 2-3 days Consults called: none  Admission status: Inpatient    Procedures:   Bilateral lower extremity Doppler study, negative for  DVT 2D echocardiogram-pending  Antimicrobials:  Anti-infectives (From admission, onward)   None      Objective: Vitals:   04/29/17 2121 04/30/17 0533 04/30/17 0851 04/30/17 1043  BP:  (!) 148/83  130/76  Pulse: 70 66 98 89  Resp: 19 20 16 17   Temp:  98.4 F (36.9 C)  98.3 F (36.8 C)  TempSrc:  Oral  Oral  SpO2: 98% 99% 94% 97%  Weight:      Height:        Intake/Output Summary (Last 24 hours) at 04/30/2017 1228 Last data filed at 04/30/2017 1000 Gross per 24 hour  Intake 317.6 ml  Output 1175 ml  Net -857.4 ml   Filed Weights   04/29/17 1316 04/29/17 2039  Weight: 102.1 kg (225 lb) 102.3 kg (225 lb 8.5 oz)    Examination:  General exam: Appears calm and comfortable, complaining of mild shortness of breath and cough Respiratory system: Clear to auscultation. Respiratory effort normal. Cardiovascular system: S1 & S2 heard, RRR. No JVD, murmurs, rubs, gallops or clicks. No pedal edema. Gastrointestinal system: Abdomen is nondistended, soft and nontender. No organomegaly or masses felt. Normal bowel sounds heard. Central nervous system: Alert and oriented. No focal neurological deficits. Extremities: Left hemiplegia, left arm strength about 3 out of 5, left lower extremity 1 out of 5 with edema, right upper and lower extremity 5 out of 5, positive pulses negative for any edema Skin: No rashes, lesions or ulcers Psychiatry: Judgement and insight appear normal. Mood & affect appropriate.     Data Reviewed: I have personally reviewed following labs and imaging studies  CBC: Recent Labs  Lab 04/29/17 1413 04/30/17 0845  WBC 9.5 10.7*  NEUTROABS 6.1  --   HGB 12.5* 12.7*  HCT 37.3* 38.8*  MCV 87.8 87.0  PLT 207 233   Basic Metabolic Panel: Recent Labs  Lab 04/29/17 1413  NA 135  K 3.7  CL 101  CO2 27  GLUCOSE 193*  BUN 11  CREATININE 1.12  CALCIUM 9.5   GFR: Estimated Creatinine Clearance: 72.3 mL/min (by C-G formula based on SCr of 1.12  mg/dL). Liver Function Tests: No results for input(s): AST, ALT, ALKPHOS, BILITOT, PROT, ALBUMIN in the last 168 hours. No results for input(s): LIPASE, AMYLASE in the last 168 hours. No results for input(s): AMMONIA in the last 168 hours. Coagulation Profile: No results for input(s): INR, PROTIME in the last 168 hours. Cardiac Enzymes: Recent Labs  Lab 04/29/17 1413 04/30/17 0845  TROPONINI 0.05* 0.05*   BNP (last 3 results) No results for input(s): PROBNP in the last 8760 hours. HbA1C: Recent Labs    04/30/17 0845  HGBA1C 7.3*   CBG: No results for input(s): GLUCAP in the last 168 hours. Lipid Profile: No results for input(s): CHOL, HDL, LDLCALC, TRIG, CHOLHDL, LDLDIRECT in the last 72 hours. Thyroid Function Tests: Recent Labs    04/30/17 0845  TSH 1.408   Anemia Panel: No results for input(s): VITAMINB12, FOLATE, FERRITIN, TIBC, IRON, RETICCTPCT in the last 72 hours. Sepsis Labs: No results for input(s): PROCALCITON, LATICACIDVEN in the last 168 hours.  No results found for this or any previous visit (from the past 240 hour(s)).     Radiology Studies: Dg Chest 2 View  Result Date: 04/29/2017 CLINICAL DATA:  Cough, wheeze, and shortness of breath EXAM: CHEST  2 VIEW COMPARISON:  None. FINDINGS: Elevated right diaphragm with eventration appearance in the lateral projection. Borderline heart size. Aortic tortuosity. There is no edema, consolidation, effusion, or pneumothorax. No acute osseous finding. IMPRESSION: 1. No acute finding. 2. Elevated right diaphragm that is likely from eventration. Electronically Signed   By: Marnee SpringJonathon  Watts M.D.   On: 04/29/2017 13:48   Ct Angio Chest Pe W And/or Wo Contrast  Result Date: 04/29/2017 CLINICAL DATA:  Shortness of breath. Flu-like symptoms for 3 days. Positive D-dimer. Unable to lift arm. EXAM: CT ANGIOGRAPHY CHEST WITH CONTRAST TECHNIQUE: Multidetector CT imaging of the chest was performed using the standard protocol  during bolus administration of intravenous contrast. Multiplanar CT image reconstructions and MIPs were obtained to evaluate the vascular anatomy. CONTRAST:  100mL ISOVUE-370 IOPAMIDOL (ISOVUE-370) INJECTION 76% COMPARISON:  Chest radiograph earlier today. FINDINGS: Cardiovascular: Cardiomegaly. Three-vessel coronary artery calcification. Calcification of the thoracic aorta and its major branches, without visible dissection. There is satisfactory opacification of the pulmonary arteries to the segmental level. There is no central or proximal pulmonary embolus. Visualization of the subsegmental pulmonary arterial branches is less optimal due to respiratory motion and positioning. However there is concern for a RIGHT posterior segment lower lobe subsegmental pulmonary embolus as seen on series 10, image 143. Mediastinum/Nodes: No enlarged mediastinal, hilar, or axillary lymph nodes. Thyroid gland, trachea, and esophagus demonstrate no significant findings. Lungs/Pleura: Lungs are clear. No pleural effusion or pneumothorax. Upper Abdomen: RIGHT hemidiaphragm elevation. Musculoskeletal: No chest wall abnormality. No acute or significant osseous findings. Review of the MIP images confirms the above findings. IMPRESSION: No central or segmental pulmonary embolus. However, there is concern for a RIGHT posterior segment, lower lobe subsegmental pulmonary embolus. See discussion above. Cardiomegaly with 3 vessel coronary artery calcification. Aortic Atherosclerosis (ICD10-I70.0). Electronically Signed   By: Elsie StainJohn T Curnes M.D.   On: 04/29/2017 15:55    Scheduled Meds: . allopurinol  100 mg Oral Daily  . atorvastatin  40 mg Oral Daily  . cloNIDine  0.1 mg Oral BID  . colchicine  0.6 mg Oral Daily  . furosemide  20 mg Oral Daily  . gabapentin  100-300 mg Oral QHS  . insulin aspart  0-5 Units Subcutaneous QHS  . insulin aspart  0-9 Units Subcutaneous TID WC  . ipratropium  0.5 mg Nebulization TID  . latanoprost  1  drop Both Eyes QHS  . levalbuterol  1.25 mg Nebulization TID  . levothyroxine  25 mcg Oral QAC breakfast  . losartan  50 mg Oral Daily  . predniSONE  40 mg Oral Q breakfast  . tamsulosin  0.4 mg Oral QPC supper   Continuous Infusions: . heparin 1,400 Units/hr (04/30/17 1134)     LOS: 1 day    Time spent: >25 minutes   Kendell BaneSeyed A Shahmehdi, MD [facility] Pager [phone]  If 7PM-7AM, please contact night-coverage [email or web address] Password [email or web address] 04/30/2017, 12:28 PM

## 2017-04-30 NOTE — Progress Notes (Signed)
ANTICOAGULATION CONSULT NOTE - Follow Up Consult  Pharmacy Consult for Heparin Indication: pulmonary embolus  No Known Allergies  Patient Measurements: Height: 5\' 9"  (175.3 cm) Weight: 225 lb 8.5 oz (102.3 kg) IBW/kg (Calculated) : 70.7 Heparin Dosing Weight: 92.5 kg  Vital Signs: Temp: 98.3 F (36.8 C) (12/13 1043) Temp Source: Oral (12/13 1043) BP: 130/76 (12/13 1043) Pulse Rate: 89 (12/13 1043)  Labs: Recent Labs    04/29/17 1413 04/30/17 0845  HGB 12.5* 12.7*  HCT 37.3* 38.8*  PLT 207 233  HEPARINUNFRC  --  0.90*  CREATININE 1.12  --   TROPONINI 0.05* 0.05*    Estimated Creatinine Clearance: 72.3 mL/min (by C-G formula based on SCr of 1.12 mg/dL).   Medications:  Medications Prior to Admission  Medication Sig Dispense Refill Last Dose  . allopurinol (ZYLOPRIM) 100 MG tablet      . atorvastatin (LIPITOR) 40 MG tablet Take 40 mg by mouth daily.  0   . cloNIDine (CATAPRES) 0.1 MG tablet Take 0.1 mg by mouth 2 (two) times daily.  0   . colchicine 0.6 MG tablet Take 0.6 mg by mouth.     . furosemide (LASIX) 20 MG tablet Take 20 mg by mouth daily.  1   . gabapentin (NEURONTIN) 100 MG capsule TK 1 TO 3 CS PO HS PRF LEG PAIN     . HYDROcodone-acetaminophen (NORCO) 10-325 MG tablet Take 1 tablet by mouth 3 (three) times daily as needed for pain.  0   . hydrOXYzine (ATARAX/VISTARIL) 25 MG tablet      . latanoprost (XALATAN) 0.005 % ophthalmic solution      . levothyroxine (SYNTHROID, LEVOTHROID) 25 MCG tablet Take 25 mcg by mouth daily before breakfast.  0   . losartan (COZAAR) 50 MG tablet TK 1 T PO  DAILY     . meloxicam (MOBIC) 7.5 MG tablet      . nystatin cream (MYCOSTATIN) Apply topically 2 (two) times daily. 30 g 3   . pantoprazole (PROTONIX) 40 MG tablet      . polyethylene glycol powder (GLYCOLAX/MIRALAX) powder      . predniSONE (STERAPRED UNI-PAK 21 TAB) 10 MG (21) TBPK tablet TK UTD     . tamsulosin (FLOMAX) 0.4 MG CAPS capsule      . traMADol (ULTRAM) 50  MG tablet TK 1 T PO  Q 6 HOURS PRN P     . Travoprost, BAK Free, (TRAVATAN Z) 0.004 % SOLN ophthalmic solution Place 1 drop into both eyes nightly.     . traZODone (DESYREL) 100 MG tablet      . Triamcinolone Acetonide 0.025 % LOTN       Scheduled:  . allopurinol  100 mg Oral Daily  . atorvastatin  40 mg Oral Daily  . cloNIDine  0.1 mg Oral BID  . colchicine  0.6 mg Oral Daily  . furosemide  20 mg Oral Daily  . gabapentin  100-300 mg Oral QHS  . insulin aspart  0-5 Units Subcutaneous QHS  . insulin aspart  0-9 Units Subcutaneous TID WC  . ipratropium  0.5 mg Nebulization TID  . latanoprost  1 drop Both Eyes QHS  . levalbuterol  1.25 mg Nebulization TID  . levothyroxine  25 mcg Oral QAC breakfast  . losartan  50 mg Oral Daily  . predniSONE  40 mg Oral Q breakfast  . tamsulosin  0.4 mg Oral QPC supper    Assessment: 71 yo M presented on 12/12 with flu like  symptoms.  SOB and chest tightness CT angiogram revealed signs of right posterior segment, lower lobe  pulmonary embolus. Risk factors include decreased mobility.  Pharmacy was consulted to start IV heparin infusion. Hgb 12.5, plts wnl on admit, stable, normocytic normochromic anemia per MD.  Heparin level lab this AM, not done on time, had to re-enter labs.  -Initial heparin level = 0.9 on heparin drip 1600 units/hr.  Hgb 12.7 low/stable and pltc 233K  No bleeding noted   -  BLE venous duplex 04/30/17: negative.   Goal of Therapy:  Heparin level 0.3-0.7 units/ml Monitor platelets by anticoagulation protocol: Yes   Plan:  Decrease IV heparin drip to 1400 units/hr Check  6 hour heparin level Daily heparin level and CBC  Thank you for allowing pharmacy to be part of this patients care team.  Noah Delaineuth Carriann Hesse, RPh Clinical Pharmacist [id number] [phone] [id number] [phone] [facility] [phone] 04/30/2017,10:55 AM

## 2017-04-30 NOTE — Progress Notes (Signed)
Patient pulled out his PIV.

## 2017-04-30 NOTE — Care Management Note (Signed)
Case Management Note  Patient Details  Name: William Waller MRN: [id number] Date of Birth: 1946/04/11  Subjective/Objective:   CM following for progression and d/c planning.                  Action/Plan: 04/30/2017 Pt most likely will d/c on Xarelto or Eliquis, request for copay information sent to pt insurance provider, await response. CM can provide 30 day supply for free of either medication. Will inform pt attending Dr Flossie DibbleShahmehdi of cost for pt when info available.   Expected Discharge Date:                  Expected Discharge Plan:  Home/Self Care  In-House Referral:  NA  Discharge planning Services  CM Consult, Medication Assistance  Post Acute Care Choice:  NA Choice offered to:  NA  DME Arranged:  N/A DME Agency:  NA  HH Arranged:  NA HH Agency:  NA  Status of Service:  In process, will continue to follow  If discussed at Long Length of Stay Meetings, dates discussed:    Additional Comments:  Starlyn SkeansRoyal, Bergen Magner U, RN 04/30/2017, 3:27 PM

## 2017-04-30 NOTE — Progress Notes (Signed)
*  PRELIMINARY RESULTS* Vascular Ultrasound Bilateral lower extremity venous duplex has been completed.  Preliminary findings: No evidence of deep vein thrombosis or baker's cysts bilaterally.   Tempie DonningCharlotte C Rodert Hinch 04/30/2017, 10:08 AM

## 2017-04-30 NOTE — Progress Notes (Signed)
Patient's heparin iv infusion resumed at this time.Was stopped earlier at 1525 due to loss of PIV.

## 2017-04-30 NOTE — H&P (Addendum)
History and Physical    William Waller ONG:[id number] DOB: 21-Dec-1945 DOA: 04/29/2017  Referring MD/NP/PA: Dr. Lasandra Beech PCP: [facility]  Patient coming from: [facility] transfer  Chief Complaint: Cough  I have personally briefly reviewed patient's old medical records in [facility]   HPI: William Waller is a 71 y.o. male with medical history significant of HTN CVA with left-sided residual weakness, wheelchair-bound, remote history of tobacco abuse and gout; who presents with complaints of a productive cough for the last 3-4 days.  Patient sister is his primary care giver at home.  Associated symptoms include wheezing, centralized chest tightness, chills, and worsening left lower extremity swelling.  At baseline, he is wheelchair-bound, but is able to help with transitioning.  Today however his sister noted that he complained of severe left lower extremity pain which was unusual for him and she noted that his left leg was bigger than his right.  Patient denies being around any recent sick contacts and patient reported being up-to-date on his flu vaccine.  Denies trying anything that worsened or relieved symptoms.  ED Course: Upon admission to the emergency department patient was seen to be afebrile, respirations 20-26, heart rates up to 116, blood pressures 135/74-166/94, and O2 saturations 90-98% on room air.  Labs revealed WBC 9.5, hemoglobin 12.5, glucose 193, d-dimer 1.2, and troponin 0.05.  CT angiogram of the chest showed signs of right posterior segment possible posterior pulmonary embolus and cardiomegaly with three-vessel coronary artery calcification.  Patient was given breathing treatments, 40 mg of prednisone, and started on heparin drip.  [facility] accepted patient to a telemetry bed at [facility].  Review of Systems  Constitutional: Positive for chills and malaise/fatigue. Negative for fever.  HENT: Positive for congestion. Negative for ear discharge and ear  pain.   Eyes: Negative for double vision and photophobia.  Respiratory: Positive for cough, shortness of breath and wheezing.   Cardiovascular: Positive for chest pain (Chest tightness) and leg swelling.  Gastrointestinal: Negative for abdominal pain, nausea and vomiting.  Genitourinary: Negative for dysuria and frequency.  Musculoskeletal: Positive for myalgias. Negative for falls.  Skin: Negative for itching and rash.  Neurological: Positive for focal weakness (Chronic). Negative for speech change.  Endo/Heme/Allergies: Negative for polydipsia. Does not bruise/bleed easily.  Psychiatric/Behavioral: Negative for substance abuse. The patient is not nervous/anxious.      Past Medical History:  Diagnosis Date  . Arthritis   . Diverticulitis   . Gout   . Hypertension   . Pinched nerve    in back  . Stroke [facility])    left side deficit    Past Surgical History:  Procedure Laterality Date  . COLON SURGERY    . REPLACEMENT TOTAL KNEE       reports that he has quit smoking. His smoking use included cigarettes. he has never used smokeless tobacco. He reports that he does not drink alcohol or use drugs.  No Known Allergies  History reviewed.  Significant for cancer, hypertension, diabetes, and stroke in his mother.  Prior to Admission medications   Medication Sig Start Date End Date Taking? Authorizing Provider  allopurinol (ZYLOPRIM) 100 MG tablet  06/11/16   [provider]  atorvastatin (LIPITOR) 40 MG tablet Take 40 mg by mouth daily. 01/20/17   [provider]  cloNIDine (CATAPRES) 0.1 MG tablet Take 0.1 mg by mouth 2 (two) times daily. 01/30/17   [provider]  colchicine 0.6 MG tablet Take 0.6 mg by  mouth. 09/02/10   [provider]  furosemide (LASIX) 20 MG tablet Take 20 mg by mouth daily. 03/25/17   [provider]  gabapentin (NEURONTIN) 100 MG capsule TK 1 TO 3 CS PO HS PRF LEG PAIN 10/09/15   [provider]    HYDROcodone-acetaminophen (NORCO) 10-325 MG tablet Take 1 tablet by mouth 3 (three) times daily as needed for pain. 04/17/17   [provider]  hydrOXYzine (ATARAX/VISTARIL) 25 MG tablet  07/11/16   [provider]  latanoprost (XALATAN) 0.005 % ophthalmic solution  06/11/16   [provider]  levothyroxine (SYNTHROID, LEVOTHROID) 25 MCG tablet Take 25 mcg by mouth daily before breakfast. 04/10/17   [provider]  losartan (COZAAR) 50 MG tablet TK 1 T PO  DAILY 09/10/15   [provider]  meloxicam (MOBIC) 7.5 MG tablet  06/27/15   [provider]  nystatin cream (MYCOSTATIN) Apply topically 2 (two) times daily. 02/04/17   Sheard, Myeong O, DPM  pantoprazole (PROTONIX) 40 MG tablet  02/25/16   [provider]  polyethylene glycol powder (GLYCOLAX/MIRALAX) powder  02/25/16   [provider]  predniSONE (STERAPRED UNI-PAK 21 TAB) 10 MG (21) TBPK tablet TK UTD 10/09/15   [provider]  tamsulosin (FLOMAX) 0.4 MG CAPS capsule  06/20/16   [provider]  traMADol (ULTRAM) 50 MG tablet TK 1 T PO  Q 6 HOURS PRN P 06/07/15   [provider]  Travoprost, BAK Free, (TRAVATAN Z) 0.004 % SOLN ophthalmic solution Place 1 drop into both eyes nightly. 03/03/16   [provider]  traZODone (DESYREL) 100 MG tablet  02/25/16   [provider]  Triamcinolone Acetonide 0.025 % LOTN  06/13/16   [provider]    Physical Exam:  Constitutional: Obese male who appears to be in some respiratory distress Vitals:   04/29/17 1730 04/29/17 1744 04/29/17 2039 04/29/17 2121  BP: (!) 158/79  (!) 150/90   Pulse: (!) 39 (!) 116 96 70  Resp: (!) 26  (!) 21 19  Temp:   98.8 F (37.1 C)   TempSrc:   Oral   SpO2: 93%  96% 98%  Weight:   102.3 kg (225 lb 8.5 oz)   Height:   5\' 9"  (1.753 m)    Eyes: PERRL, lids and conjunctivae normal ENMT: Mucous membranes are moist. Posterior pharynx clear of any  exudate or lesions.   Neck: normal, supple, no masses, no thyromegaly Respiratory: Tachypneic with bilateral wheezes, no crackles.  Patient able to talk in full sentences. Cardiovascular: Tachycardic, no murmurs / rubs / gallops.  2+ pitting edema on the left lower extremity and 1+ on the right lower extremity.. 2+ pedal pulses. No carotid bruits.  Abdomen: no tenderness, no masses palpated. No hepatosplenomegaly. Bowel sounds positive.  Musculoskeletal: no clubbing / cyanosis. No joint deformity upper and lower extremities. Good ROM, no contractures. Normal muscle tone.  Skin: no rashes, lesions, ulcers. No induration Neurologic: CN 2-12 grossly intact.  Strength 5 out of 5 on the right upper and lower extremity.  Strength 2 out of 5 on the left lower and upper extremities. Psychiatric: Normal judgment and insight. Alert and oriented x 3. Normal mood.     Labs on Admission: I have personally reviewed following labs and imaging studies  CBC: Recent Labs  Lab 04/29/17 1413  WBC 9.5  NEUTROABS 6.1  HGB 12.5*  HCT 37.3*  MCV 87.8  PLT 207   Basic Metabolic Panel:  Recent Labs  Lab 04/29/17 1413  NA 135  K 3.7  CL 101  CO2 27  GLUCOSE 193*  BUN 11  CREATININE 1.12  CALCIUM 9.5   GFR: Estimated Creatinine Clearance: 72.3 mL/min (by C-G formula based on SCr of 1.12 mg/dL). Liver Function Tests: No results for input(s): AST, ALT, ALKPHOS, BILITOT, PROT, ALBUMIN in the last 168 hours. No results for input(s): LIPASE, AMYLASE in the last 168 hours. No results for input(s): AMMONIA in the last 168 hours. Coagulation Profile: No results for input(s): INR, PROTIME in the last 168 hours. Cardiac Enzymes: Recent Labs  Lab 04/29/17 1413  TROPONINI 0.05*   BNP (last 3 results) No results for input(s): PROBNP in the last 8760 hours. HbA1C: No results for input(s): HGBA1C in the last 72 hours. CBG: No results for input(s): GLUCAP in the last 168 hours. Lipid Profile: No  results for input(s): CHOL, HDL, LDLCALC, TRIG, CHOLHDL, LDLDIRECT in the last 72 hours. Thyroid Function Tests: No results for input(s): TSH, T4TOTAL, FREET4, T3FREE, THYROIDAB in the last 72 hours. Anemia Panel: No results for input(s): VITAMINB12, FOLATE, FERRITIN, TIBC, IRON, RETICCTPCT in the last 72 hours. Urine analysis: No results found for: COLORURINE, APPEARANCEUR, LABSPEC, PHURINE, GLUCOSEU, HGBUR, BILIRUBINUR, KETONESUR, PROTEINUR, UROBILINOGEN, NITRITE, LEUKOCYTESUR Sepsis Labs: No results found for this or any previous visit (from the past 240 hour(s)).   Radiological Exams on Admission: Dg Chest 2 View  Result Date: 04/29/2017 CLINICAL DATA:  Cough, wheeze, and shortness of breath EXAM: CHEST  2 VIEW COMPARISON:  None. FINDINGS: Elevated right diaphragm with eventration appearance in the lateral projection. Borderline heart size. Aortic tortuosity. There is no edema, consolidation, effusion, or pneumothorax. No acute osseous finding. IMPRESSION: 1. No acute finding. 2. Elevated right diaphragm that is likely from eventration. Electronically Signed   By: Marnee Spring M.D.   On: 04/29/2017 13:48   Ct Angio Chest Pe W And/or Wo Contrast  Result Date: 04/29/2017 CLINICAL DATA:  Shortness of breath. Flu-like symptoms for 3 days. Positive D-dimer. Unable to lift arm. EXAM: CT ANGIOGRAPHY CHEST WITH CONTRAST TECHNIQUE: Multidetector CT imaging of the chest was performed using the standard protocol during bolus administration of intravenous contrast. Multiplanar CT image reconstructions and MIPs were obtained to evaluate the vascular anatomy. CONTRAST:  ISOVUE-370 IOPAMIDOL (ISOVUE-370) INJECTION 76% COMPARISON:  Chest radiograph earlier today. FINDINGS: Cardiovascular: Cardiomegaly. Three-vessel coronary artery calcification. Calcification of the thoracic aorta and its major branches, without visible dissection. There is satisfactory opacification of the pulmonary arteries to the  segmental level. There is no central or proximal pulmonary embolus. Visualization of the subsegmental pulmonary arterial branches is less optimal due to respiratory motion and positioning. However there is concern for a RIGHT posterior segment lower lobe subsegmental pulmonary embolus as seen on series 10, image 143. Mediastinum/Nodes: No enlarged mediastinal, hilar, or axillary lymph nodes. Thyroid gland, trachea, and esophagus demonstrate no significant findings. Lungs/Pleura: Lungs are clear. No pleural effusion or pneumothorax. Upper Abdomen: RIGHT hemidiaphragm elevation. Musculoskeletal: No chest wall abnormality. No acute or significant osseous findings. Review of the MIP images confirms the above findings. IMPRESSION: No central or segmental pulmonary embolus. However, there is concern for a RIGHT posterior segment, lower lobe subsegmental pulmonary embolus. See discussion above. Cardiomegaly with 3 vessel coronary artery calcification. Aortic Atherosclerosis (ICD10-I70.0). Electronically Signed   By: Elsie Stain M.D.   On: 04/29/2017 15:55    EKG: Independently reviewed.  Sinus tachycardia at 110 bpm with PVC  Assessment/Plan Suspected pulmonary embolus:  Acute.  Patient presents with complaints of cough and chest tightness.  CT angiogram reveals signs of a pulmonary embolus.  Risk factors include decreased mobility.  Patient started on heparin gtt. - Admit to telemetry bed - Continue heparin per pharmacy  - Check echocardiogram - Check vascular doppler duplex ultrasound of the lower extremities - Determine best oral anticoagulant therapy in a.m.  Elevated troponin: Acute troponin mildly elevated 0.05 on admission.   -Trend cardiac troponins  Bronchitis due to tobacco use: Patient with remote history of tobacco use, but found to be acutely wheezing on physical exam.  Chest x-ray shows no clear signs of a pulmonary infiltrate. - Continue prednisone  Normocytic normochromic anemia:  Hemoglobin 12.5 on admission appears stable and patient denies any complaints of bleeding. - Check CBC in a.m.  Hyperglycemia: Acute.  Initial glucose elevated at 193 on admission.  Elevation could be due to acute distress. - Continue to monitor and consider further testing if needed  History of CVA with residual left-sided weakness: Stable.  Essential hypertension - Continue losartan and clonidine  Hypothyroidism - Check TSH - Continue levothyroxine  DVT prophylaxis: Heparin Code Status: Full Family Communication: Discussed plan of care with the patient as well as his sister who acts as his caregiver by phone Disposition Plan: Likely discharge home in 2-3 days Consults called: none  Admission status: Inpatient  Clydie Braunondell A Sharronda Schweers MD [facility] Pager [phone]   If 7PM-7AM, please contact night-coverage [email or web address] Password [email or web address]  04/30/2017, 1:54 AM

## 2017-04-30 NOTE — Progress Notes (Signed)
ANTICOAGULATION CONSULT NOTE - Follow Up Consult  Pharmacy Consult for Heparin Indication: pulmonary embolus  No Known Allergies  Patient Measurements: Height: 5\' 9"  (175.3 cm) Weight: 225 lb 8.5 oz (102.3 kg) IBW/kg (Calculated) : 70.7 Heparin Dosing Weight: 92.5 kg  Vital Signs: Temp: 98.3 F (36.8 C) (12/13 1043) Temp Source: Oral (12/13 1043) BP: 130/76 (12/13 1043) Pulse Rate: 89 (12/13 1043)  Labs: Recent Labs    04/29/17 1413 04/30/17 0845 04/30/17 1157 04/30/17 1850  HGB 12.5* 12.7*  --   --   HCT 37.3* 38.8*  --   --   PLT 207 233  --   --   HEPARINUNFRC  --  0.90*  --  0.12*  CREATININE 1.12  --   --   --   TROPONINI 0.05* 0.05* 0.04*  --     Estimated Creatinine Clearance: 72.3 mL/min (by C-G formula based on SCr of 1.12 mg/dL).   Medications:  Medications Prior to Admission  Medication Sig Dispense Refill Last Dose  . atorvastatin (LIPITOR) 40 MG tablet Take 40 mg by mouth daily at 6 PM.   0 04/29/2017 at Unknown time  . cloNIDine (CATAPRES) 0.1 MG tablet Take 0.1 mg by mouth 2 (two) times daily.  0 04/29/2017 at Unknown time  . colchicine 0.6 MG tablet Take 0.6 mg by mouth 2 (two) times daily.    [facility] at [employer]  . furosemide (LASIX) 20 MG tablet Take 20 mg by mouth daily as needed for fluid.   1 unk at unk  . HYDROcodone-acetaminophen (NORCO) 10-325 MG tablet Take 1 tablet by mouth every 8 (eight) hours as needed for moderate pain.   0 Past Month at unk  . hydrOXYzine (ATARAX/VISTARIL) 25 MG tablet Take 25 mg by mouth 3 (three) times daily as needed for itching.    unk at unk  . levothyroxine (SYNTHROID, LEVOTHROID) 25 MCG tablet Take 25 mcg by mouth daily before breakfast.  0 04/29/2017 at Unknown time  . losartan (COZAAR) 50 MG tablet TAKE 50MG  BY MOUTH ONCE  DAILY   04/29/2017 at Unknown time  . nystatin cream (MYCOSTATIN) Apply topically 2 (two) times daily. 30 g 3 04/29/2017 at Unknown time  . tamsulosin (FLOMAX) 0.4 MG CAPS capsule Take 0.4 mg by mouth  daily.    Past Month at Unknown time  . Travoprost, BAK Free, (TRAVATAN Z) 0.004 % SOLN ophthalmic solution Place 1 drop into both eyes nightly.   Past Week at Unknown time   Scheduled:  . allopurinol  100 mg Oral Daily  . atorvastatin  40 mg Oral Daily  . cloNIDine  0.1 mg Oral BID  . colchicine  0.6 mg Oral Daily  . furosemide  20 mg Oral Daily  . gabapentin  100-300 mg Oral QHS  . insulin aspart  0-5 Units Subcutaneous QHS  . insulin aspart  0-9 Units Subcutaneous TID WC  . ipratropium  0.5 mg Nebulization TID  . latanoprost  1 drop Both Eyes QHS  . levalbuterol  1.25 mg Nebulization TID  . levothyroxine  25 mcg Oral QAC breakfast  . losartan  50 mg Oral Daily  . predniSONE  40 mg Oral Q breakfast  . tamsulosin  0.4 mg Oral QPC supper    Assessment: 71 yo M presented on 12/12 with right posterior segment lower lobe  pulmonary embolus. Pharmacy was consulted to start IV heparin infusion. Hgb 12.7 low/stable and pltc 233K  No bleeding noted   Heparin level lab this AM  was elevated (0.9) and drip rate was decreased.   Now, repeat heparin level is low at 0.12.  Upon speaking to RN, IV was out for a couple of hours and heparin was off though not documented in chart (1525 until 1800) therefore lab is not accurate.   Goal of Therapy:  Heparin level 0.3-0.7 units/ml Monitor platelets by anticoagulation protocol: Yes   Plan:  Continue  IV heparin drip to 1400 units/hr Repeat level at 6 hr mark from restart (midnight).  Daily heparin level and CBC  Thank you for allowing pharmacy to be part of this patients care team.  Link SnufferJessica Khizar Fiorella, PharmD, BCPS, BCCCP Clinical Pharmacist Clinical phone 04/30/2017 until 11PM [phone]- #[phone] After hours, please call #[phone] 04/30/2017,7:51 PM

## 2017-05-01 DIAGNOSIS — E119 Type 2 diabetes mellitus without complications: Secondary | ICD-10-CM

## 2017-05-01 DIAGNOSIS — D649 Anemia, unspecified: Secondary | ICD-10-CM

## 2017-05-01 DIAGNOSIS — E113559 Type 2 diabetes mellitus with stable proliferative diabetic retinopathy, unspecified eye: Secondary | ICD-10-CM

## 2017-05-01 LAB — CBC
HCT: 37.4 % — ABNORMAL LOW (ref 39.0–52.0)
HEMOGLOBIN: 12.2 g/dL — AB (ref 13.0–17.0)
MCH: 28.4 pg (ref 26.0–34.0)
MCHC: 32.6 g/dL (ref 30.0–36.0)
MCV: 87.2 fL (ref 78.0–100.0)
Platelets: 229 10*3/uL (ref 150–400)
RBC: 4.29 MIL/uL (ref 4.22–5.81)
RDW: 14.2 % (ref 11.5–15.5)
WBC: 10 10*3/uL (ref 4.0–10.5)

## 2017-05-01 LAB — RESPIRATORY PANEL BY PCR
Adenovirus: NOT DETECTED
Bordetella pertussis: NOT DETECTED
Chlamydophila pneumoniae: NOT DETECTED
Coronavirus 229E: NOT DETECTED
Coronavirus HKU1: NOT DETECTED
Coronavirus NL63: NOT DETECTED
Coronavirus OC43: NOT DETECTED
INFLUENZA A-RVPPCR: NOT DETECTED
INFLUENZA B-RVPPCR: NOT DETECTED
METAPNEUMOVIRUS-RVPPCR: NOT DETECTED
Mycoplasma pneumoniae: NOT DETECTED
PARAINFLUENZA VIRUS 2-RVPPCR: NOT DETECTED
PARAINFLUENZA VIRUS 3-RVPPCR: NOT DETECTED
PARAINFLUENZA VIRUS 4-RVPPCR: NOT DETECTED
Parainfluenza Virus 1: NOT DETECTED
RESPIRATORY SYNCYTIAL VIRUS-RVPPCR: NOT DETECTED
RHINOVIRUS / ENTEROVIRUS - RVPPCR: DETECTED — AB

## 2017-05-01 LAB — HEPARIN LEVEL (UNFRACTIONATED)
Heparin Unfractionated: 0.5 IU/mL (ref 0.30–0.70)
Heparin Unfractionated: 0.61 IU/mL (ref 0.30–0.70)

## 2017-05-01 LAB — COMPREHENSIVE METABOLIC PANEL
ALK PHOS: 60 U/L (ref 38–126)
ALT: 18 U/L (ref 17–63)
ANION GAP: 9 (ref 5–15)
AST: 24 U/L (ref 15–41)
Albumin: 3.7 g/dL (ref 3.5–5.0)
BUN: 10 mg/dL (ref 6–20)
CALCIUM: 9.4 mg/dL (ref 8.9–10.3)
CHLORIDE: 103 mmol/L (ref 101–111)
CO2: 25 mmol/L (ref 22–32)
Creatinine, Ser: 0.93 mg/dL (ref 0.61–1.24)
GFR calc non Af Amer: >60 mL/min (ref >60)
Glucose, Bld: 129 mg/dL — ABNORMAL HIGH (ref 65–99)
POTASSIUM: 3.5 mmol/L (ref 3.5–5.1)
SODIUM: 137 mmol/L (ref 135–145)
Total Bilirubin: 0.4 mg/dL (ref 0.3–1.2)
Total Protein: 6.8 g/dL (ref 6.5–8.1)

## 2017-05-01 LAB — GLUCOSE, CAPILLARY
Glucose-Capillary: 129 mg/dL — ABNORMAL HIGH (ref 65–99)
Glucose-Capillary: 131 mg/dL — ABNORMAL HIGH (ref 65–99)

## 2017-05-01 MED ORDER — RIVAROXABAN (XARELTO) VTE STARTER PACK (15 & 20 MG): ORAL_TABLET | ORAL | 0 refills | Status: AC

## 2017-05-01 MED ORDER — GLIPIZIDE ER 2.5 MG PO TB24
2.5000 mg | ORAL_TABLET | Freq: Every day | ORAL | Status: DC
  Administered 2017-05-01: 2.5 mg via ORAL
  Filled 2017-05-01: qty 1

## 2017-05-01 MED ORDER — GLIPIZIDE ER 2.5 MG PO TB24
2.5000 mg | ORAL_TABLET | Freq: Every day | ORAL | 0 refills | Status: AC
Start: 2017-05-01 — End: 2017-05-31

## 2017-05-01 MED ORDER — RIVAROXABAN 15 MG PO TABS
15.0000 mg | ORAL_TABLET | Freq: Two times a day (BID) | ORAL | Status: DC
  Administered 2017-05-01: 15 mg via ORAL
  Filled 2017-05-01: qty 1

## 2017-05-01 MED ORDER — HYDRALAZINE HCL 50 MG PO TABS
50.0000 mg | ORAL_TABLET | Freq: Three times a day (TID) | ORAL | Status: DC
  Administered 2017-05-01 (×2): 50 mg via ORAL
  Filled 2017-05-01 (×2): qty 1

## 2017-05-01 MED ORDER — RIVAROXABAN (XARELTO) EDUCATION KIT FOR DVT/PE PATIENTS
PACK | Freq: Once | Status: AC
  Administered 2017-05-01: 16:00:00
  Filled 2017-05-01: qty 1

## 2017-05-01 MED ORDER — LIVING WELL WITH DIABETES BOOK
Freq: Once | Status: AC
  Administered 2017-05-01: 16:00:00
  Filled 2017-05-01: qty 1

## 2017-05-01 NOTE — Progress Notes (Signed)
ANTICOAGULATION CONSULT NOTE - Follow Up Consult  Pharmacy Consult for heparin Indication: pulmonary embolus  Labs: Recent Labs    04/29/17 1413 04/30/17 0845 04/30/17 1157 04/30/17 1850 04/30/17 2350  HGB 12.5* 12.7*  --   --   --   HCT 37.3* 38.8*  --   --   --   PLT 207 233  --   --   --   HEPARINUNFRC  --  0.90*  --  0.12* 0.50  CREATININE 1.12  --   --   --   --   TROPONINI 0.05* 0.05* 0.04* 0.03*  --     Assessment/Plan:  71yo male therapeutic on heparin after rate change. Will continue gtt at current rate and confirm stable with am labs.   William Waller, PharmD, BCPS  05/01/2017,12:28 AM

## 2017-05-01 NOTE — Care Management (Signed)
05/01/2017 Per pt insurance provider pt has zero dollar copay for either Eliquis or Xarelto.   Noted HH consult does pt need PT/OT eval [facility]?  Thanks

## 2017-05-01 NOTE — Progress Notes (Signed)
  RD consulted for nutrition education regarding diabetes.   Lab Results  Component Value Date   HGBA1C 7.3 (H) 04/30/2017    RD provided "Heart Healthy Carbohydrate Counting for People with Diabetes" handout from the Academy of Nutrition and Dietetics. Pt very lethargic on visit today, did not open eyes. Pt reports he will read the handouts provided but unable to provide full diet education at this time as pt falling asleep despite. Noted order for discharge home today. Will follow-up if able to provide verbal education  Romelle StarcherCate Islah Eve MS, RD, LDN, CNSC [phone] Pager  [phone] Weekend/On-Call Pager

## 2017-05-01 NOTE — Care Management Note (Signed)
Case Management Note  Patient Details  Name: William Waller MRN: [id number] Date of Birth: 1945-10-01  Subjective/Objective:      CM following for progression and d/c planning.               Action/Plan: 05/01/2017 Pt for d/c to home, pt sister is nurse and caregiver for this pt , [facility]. [facility] notified and will start services in next 48-72 hr. Per sister pt has no further DME needs. Pt has started Xarelto, 30 day free card will be given to pt ,this was explained to sister as well as the info re pt zero copay for this medication.    Expected Discharge Date:  05/01/17               Expected Discharge Plan:  Home w Home Health Services  In-House Referral:  NA  Discharge planning Services  CM Consult, Medication Assistance  Post Acute Care Choice:  NA Choice offered to:  NA, Patient(Pt sister is caregiver)  DME Arranged:  N/A DME Agency:  NA  HH Arranged:  OT, PT, Nurse's Aide, Social Work [employer] [facility]  Status of Service:  Completed, signed off  If discussed at [employer] Length of Stay Meetings, dates discussed:    Additional Comments:  Starlyn SkeansRoyal, Yunique Dearcos U, RN 05/01/2017, 10:54 AM

## 2017-05-01 NOTE — Discharge Summary (Signed)
Physician Discharge Summary      Patient: William Waller                   Admit date: 04/29/2017   DOB: 1945/07/08             Discharge date:05/01/2017/10:54 AM ZOX:[id number]                           PCP: [facility] Recommendations for Outpatient Follow-up:   1.       May follow-up with cardiology and 2-4 weeks for further evaluation 2. Please follow-up with your primary care physician within 1-2 weeks.   Discharge Condition: Stable  CODE STATUS:  Full code  Diet recommendation:  Cardiac diet / Diabetic diet / ----------------------------------------------------------------------------------------------------------------------  Discharge Diagnoses:   Principal Problem:   Pulmonary embolus (HCC) Active Problems:   Dyspnea   Elevated troponin   Hypothyroidism   Normocytic normochromic anemia   DM II (diabetes mellitus, type II), controlled (HCC)   History of present illness : William SilkMack Seymour is a 71 y.o. male with medical history significant of HTN CVA with left-sided residual weakness, wheelchair-bound, remote history of tobacco abuse and gout; who presents with complaints of a productive cough for the last 3-4 days.  Patient sister is his primary care giver at home.  Associated symptoms include wheezing, centralized chest tightness, chills, and worsening left lower extremity swelling.  At baseline, he is wheelchair-bound, but is able to help with transitioning.  Today however his sister noted that he complained of severe left lower extremity pain which was unusual for him and she noted that his left leg was bigger than his right.  Patient denies being around any recent sick contacts and patient reported being up-to-date on his flu vaccine.  Denies trying anything that worsened or relieved symptoms.  ED Course: Upon admission to the emergency department patient was seen to be afebrile, respirations 20-26, heart rates up to 116, blood pressures  135/74-166/94, and O2 saturations 90-98% on room air.  Labs revealed WBC 9.5, hemoglobin 12.5, glucose 193, d-dimer 1.2, and troponin 0.05.  CT angiogram of the chest showed signs of right posterior segment possible posterior pulmonary embolus and cardiomegaly with three-vessel coronary artery calcification.  Patient was given breathing treatments, 40 mg of prednisone, and started on heparin drip.  [facility]  Hospital course / Brief Summary:  Suspected pulmonary embolus:  Cough and shortness of breath has improved CT angiogram reveals signs of a pulmonary embolus. Risk factors include decreased mobility. Patient was started on heparin gtt. -Heparin drip, lower extremity Doppler studies were negative for any DVT,  2D echocardiogram Negative for any right heart strain, ejection fracture 50-55%, was on heparin drip which was DC'd now started on Xarelto Patient and daughter has been educated regarding medication use.  Side effects.  Risks and benefits.  Elevated troponin:  No chest pain, EKG is within normal limits, strips were reviewed for periodic tachycardia,  Bronchitisdue to tobacco WJX:BJYNWGNFAOZuse:Patientwith remote history of tobacco use,butfound to be acutely wheezing on physical exam. Chest x-ray shows no clear signs of a pulmonary infiltrate. Will discontinue prednisone, wheezing has improved Status post DuoNeb bronchodilator treatments  Normocytic normochromic anemia:Hemoglobin 12.5 on admission appears stable and patient denies any complaints of bleeding.  History of CVA -  with residual left-sided weakness:Was on heparin drip, started on XareltoEssential hypertension -Continue losartan and clonidine  Hypothyroidism -continue home dose of Synthroid  Newly diagnosed diabetes mellitus -she was persistently hyperglycemic, hemoglobin A1c seven-point the patient has been started on glipizide 2.5 mg, instructed to check blood sugar QA CHS,  keep a log, follow-up with PCP for further evaluation and medication adjustment   Consultations:  None   Procedures: CTA chest;  2D echocardiogram Study Conclusions - Left ventricle: The cavity size was normal. Wall thickness was   increased in a pattern of mild LVH. Systolic function was normal.   The estimated ejection fraction was in the range of 50% to 55%.  ----------------------------------------------------------------------------------------------------------------------  Discharge Instructions:   Discharge Instructions    Activity as tolerated - No restrictions   Complete by:  As directed    Call MD for:  difficulty breathing, headache or visual disturbances   Complete by:  As directed    Call MD for:  temperature >100.4   Complete by:  As directed    Diet - low sodium heart healthy   Complete by:  As directed    Discharge instructions   Complete by:  As directed    Please follow-up with home health, strict blood sugars, may check blood sugars in a fasting state, in the morning, before meals, please keep a log and present to PCP for further medication adjustment. You are on new medication of glipizide for diabetes mellitus.  Also Xarelto Xarelto is a blood thinner that is prescribed to you for pulmonary embolism,  Increases your risk of bleeding, GI bleed, please take every measures for fall precautions  Follow-up with PCP accordingly   Increase activity slowly   Complete by:  As directed        Medication List    STOP taking these medications   colchicine 0.6 MG tablet   HYDROcodone-acetaminophen 10-325 MG tablet Commonly known as:  NORCO   nystatin cream Commonly known as:  MYCOSTATIN     TAKE these medications   atorvastatin 40 MG tablet Commonly known as:  LIPITOR Take 40 mg by mouth daily at 6 PM.   cloNIDine 0.1 MG tablet Commonly known as:  CATAPRES Take 0.1 mg by mouth 2 (two) times daily.   furosemide 20 MG tablet Commonly known as:   LASIX Take 20 mg by mouth daily as needed for fluid.   glipiZIDE 2.5 MG 24 hr tablet Commonly known as:  GLUCOTROL XL Take 1 tablet (2.5 mg total) by mouth daily with breakfast.   hydrOXYzine 25 MG tablet Commonly known as:  ATARAX/VISTARIL Take 25 mg by mouth 3 (three) times daily as needed for itching.   levothyroxine 25 MCG tablet Commonly known as:  SYNTHROID, LEVOTHROID Take 25 mcg by mouth daily before breakfast.   losartan 50 MG tablet Commonly known as:  COZAAR TAKE 50MG  BY MOUTH ONCE  DAILY   Rivaroxaban 15 & 20 MG Tbpk Take as directed on package: Start with one 15mg  tablet by mouth twice a day with food. On Day 22, switch to one 20mg  tablet once a day with food.   tamsulosin 0.4 MG Caps capsule Commonly known as:  FLOMAX Take 0.4 mg by mouth daily.   TRAVATAN Z 0.004 % Soln ophthalmic solution Generic drug:  Travoprost (BAK Free) Place 1 drop into both eyes nightly.       No Known Allergies    Procedures/Studies: Dg Chest 2 View  Result Date: 04/29/2017 CLINICAL DATA:  Cough, wheeze, and shortness of breath EXAM: CHEST  2 VIEW COMPARISON:  None. FINDINGS: Elevated right  diaphragm with eventration appearance in the lateral projection. Borderline heart size. Aortic tortuosity. There is no edema, consolidation, effusion, or pneumothorax. No acute osseous finding. IMPRESSION: 1. No acute finding. 2. Elevated right diaphragm that is likely from eventration. Electronically Signed   By: Marnee SpringJonathon  Watts M.D.   On: 04/29/2017 13:48   Ct Angio Chest Pe W And/or Wo Contrast  Result Date: 04/29/2017 CLINICAL DATA:  Shortness of breath. Flu-like symptoms for 3 days. Positive D-dimer. Unable to lift arm. EXAM: CT ANGIOGRAPHY CHEST WITH CONTRAST TECHNIQUE: Multidetector CT imaging of the chest was performed using the standard protocol during bolus administration of intravenous contrast. Multiplanar CT image reconstructions and MIPs were obtained to evaluate the vascular  anatomy. CONTRAST:  100mL ISOVUE-370 IOPAMIDOL (ISOVUE-370) INJECTION 76% COMPARISON:  Chest radiograph earlier today. FINDINGS: Cardiovascular: Cardiomegaly. Three-vessel coronary artery calcification. Calcification of the thoracic aorta and its major branches, without visible dissection. There is satisfactory opacification of the pulmonary arteries to the segmental level. There is no central or proximal pulmonary embolus. Visualization of the subsegmental pulmonary arterial branches is less optimal due to respiratory motion and positioning. However there is concern for a RIGHT posterior segment lower lobe subsegmental pulmonary embolus as seen on series 10, image 143. Mediastinum/Nodes: No enlarged mediastinal, hilar, or axillary lymph nodes. Thyroid gland, trachea, and esophagus demonstrate no significant findings. Lungs/Pleura: Lungs are clear. No pleural effusion or pneumothorax. Upper Abdomen: RIGHT hemidiaphragm elevation. Musculoskeletal: No chest wall abnormality. No acute or significant osseous findings. Review of the MIP images confirms the above findings. IMPRESSION: No central or segmental pulmonary embolus. However, there is concern for a RIGHT posterior segment, lower lobe subsegmental pulmonary embolus. See discussion above. Cardiomegaly with 3 vessel coronary artery calcification. Aortic Atherosclerosis (ICD10-I70.0). Electronically Signed   By: Elsie StainJohn T Curnes M.D.   On: 04/29/2017 15:55     Subjective: Patient was seen and examined 05/01/2017, 10:54 AM Patient stable  Today. No acute distress.  No issues overnight Stable for discharge.  Discharge Exam:  Vitals:   05/01/17 0359 05/01/17 0700 05/01/17 0855 05/01/17 0858  BP: (!) 147/83  (!) 159/80 (!) 159/80  Pulse: 83 82  67  Resp: 20 18  19   Temp: 98.6 F (37 C)   98.4 F (36.9 C)  TempSrc: Oral   Oral  SpO2: 92% 96%  95%  Weight:      Height:        General: Pt lying comfortably in bed & appears in no obvious  distress. Cardiovascular: S1 & S2 heard, RRR, S1/S2 +. No murmurs, rubs, gallops or clicks. No JVD or pedal edema. Respiratory: Clear to auscultation without wheezing, rhonchi or crackles. No increased work of breathing. Abdominal:  Non distended, non tender & soft. No organomegaly or masses appreciated. Normal bowel sounds heard. CNS: Alert and oriented. No focal deficits. Extremities: no edema, no cyanosis    The results of significant diagnostics from this hospitalization (including imaging, microbiology, ancillary and laboratory) are listed below for reference.     Microbiology: No results found for this or any previous visit (from the past 240 hour(s)).   Labs: CBC: Recent Labs  Lab 04/29/17 1413 04/30/17 0845 05/01/17 0545  WBC 9.5 10.7* 10.0  NEUTROABS 6.1  --   --   HGB 12.5* 12.7* 12.2*  HCT 37.3* 38.8* 37.4*  MCV 87.8 87.0 87.2  PLT 207 233 229   Basic Metabolic Panel: Recent Labs  Lab 04/29/17 1413  NA 135  K 3.7  CL 101  CO2 27  GLUCOSE 193*  BUN 11  CREATININE 1.12  CALCIUM 9.5   Liver Function Tests: No results for input(s): AST, ALT, ALKPHOS, BILITOT, PROT, ALBUMIN in the last 168 hours. BNP (last 3 results) No results for input(s): BNP in the last 8760 hours. Cardiac Enzymes: Recent Labs  Lab 04/29/17 1413 04/30/17 0845 04/30/17 1157 04/30/17 1850  TROPONINI 0.05* 0.05* 0.04* 0.03*   CBG: Recent Labs  Lab 04/30/17 1313 04/30/17 1810 04/30/17 2131 05/01/17 0751  GLUCAP 155* 194* 199* 129*   Hgb A1c Recent Labs    04/30/17 0845  HGBA1C 7.3*   Lipid Profile No results for input(s): CHOL, HDL, LDLCALC, TRIG, CHOLHDL, LDLDIRECT in the last 72 hours. Thyroid function studies Recent Labs    04/30/17 0845  TSH 1.408   Anemia work up No results for input(s): VITAMINB12, FOLATE, FERRITIN, TIBC, IRON, RETICCTPCT in the last 72 hours. Urinalysis No results found for: COLORURINE, APPEARANCEUR, LABSPEC, PHURINE, GLUCOSEU, HGBUR,  BILIRUBINUR, KETONESUR, PROTEINUR, UROBILINOGEN, NITRITE, LEUKOCYTESUR    Time coordinating discharge: Over 30 minutes  SIGNED: Kendell Bane, MD, FACP, FHM. [facility] Pager [phone]  If 7PM-7AM, please contact night-coverage [email or web address] Password [facility] 05/01/2017, 10:54 AM

## 2017-05-01 NOTE — Evaluation (Signed)
Physical Therapy Evaluation Patient Details Name: William Waller MRN: [id number] DOB: 04-05-1946 Today's Date: 05/01/2017   History of Present Illness  Pt is a 71 y/o male admitted secondary to PE. PMH inlcudes HTN, CVA with L sided deficits, gout and L TKA.   Clinical Impression  Pt admitted secondary to problem above with deficits below. PTA, pt required assist for ADLs and transfers and [facility]. Upon eval, pt requiring mod A for bed mobility and basic sit<>stand transfers. Reports he normally needs assist with this type of mobility at home. Reports sister and HHaide will be able to assist at home. Recommending HHPT to address mobility deficits. Will continue to follow acutely to maximize functional mobility independence and safety.     Follow Up Recommendations Home health PT;Supervision for mobility/OOB    Equipment Recommendations  None recommended by PT(has all necessary DME )    Recommendations for Other Services       Precautions / Restrictions Precautions Precautions: Fall Restrictions Weight Bearing Restrictions: No      Mobility  Bed Mobility Overal bed mobility: Needs Assistance Bed Mobility: Supine to Sit;Sit to Supine;Rolling Rolling: Mod assist;Min assist   Supine to sit: Mod assist Sit to supine: Mod assist   General bed mobility comments: Mod A for LLE management and for trunk elevation. Required mod A for LLE management upon return to supine as well. Min A to roll to L and mod A to roll to R side   Transfers Overall transfer level: Needs assistance Equipment used: Rolling walker (2 wheeled) Transfers: Sit to/from Stand Sit to Stand: Mod assist         General transfer comment: Mod A for lift assist and steadying upon standing. Pt reports sister normally helps stand and transfer to chair.   Ambulation/Gait             General Gait Details: Pt is in WC at baseline.   Stairs            Wheelchair Mobility     Modified Rankin (Stroke Patients Only)       Balance Overall balance assessment: Needs assistance Sitting-balance support: No upper extremity supported;Feet supported Sitting balance-Leahy Scale: Good Sitting balance - Comments: Able to maintain sitting balance while PT washed back    Standing balance support: Single extremity supported Standing balance-Leahy Scale: Poor Standing balance comment: Reliant on UE and external support to stand.                              Pertinent Vitals/Pain Pain Assessment: Faces Faces Pain Scale: Hurts little more Pain Location: L arm  Pain Descriptors / Indicators: Aching;Grimacing;Guarding Pain Intervention(s): Limited activity within patient's tolerance;Monitored during session;Repositioned    Home Living Family/patient expects to be discharged to:: Private residence Living Arrangements: Other relatives(sister ) Available Help at Discharge: Family;Available 24 hours/day;Personal care attendant Type of Home: Apartment Home Access: Level entry     Home Layout: One level Home Equipment: Cane - quad;Bedside commode;Wheelchair - Careers advisermanual;Other (comment);Hospital bed(hemiwalker ) Additional Comments: Reports CNA comes and stays for a few hours 3 days/week.     Prior Function Level of Independence: Needs assistance   Gait / Transfers Assistance Needed: Required assist for transfers to [facility] using [facility]miwalker.   ADL's / Homemaking Assistance Needed: Sister and aide helped with ADLs and IADLs.         Hand Dominance  Extremity/Trunk Assessment   Upper Extremity Assessment Upper Extremity Assessment: LUE deficits/detail LUE Deficits / Details: Unable to LLE secondary to L sided weakness at baseline.     Lower Extremity Assessment Lower Extremity Assessment: LLE deficits/detail LLE Deficits / Details: L sided weakness at baseline secondary to CVA at baseline.     Cervical / Trunk Assessment Cervical / Trunk  Assessment: Kyphotic  Communication   Communication: No difficulties  Cognition Arousal/Alertness: Awake/alert Behavior During Therapy: WFL for tasks assessed/performed Overall Cognitive Status: History of cognitive impairments - at baseline                                 General Comments: CVA at baseline      General Comments General comments (skin integrity, edema, etc.): No family present during session. Reports sister will be with him at home. Educated about HHPT and pt agreeable     Exercises     Assessment/Plan    PT Assessment Patient needs continued PT services  PT Problem List Decreased strength;Decreased balance;Decreased mobility;Decreased cognition;Decreased knowledge of precautions;Pain       PT Treatment Interventions DME instruction;Functional mobility training;Therapeutic activities;Therapeutic exercise;Balance training;Neuromuscular re-education;Patient/family education;Wheelchair mobility training    PT Goals (Current goals can be found in the Care Plan section)  Acute Rehab PT Goals Patient Stated Goal: to go home  PT Goal Formulation: With patient Time For Goal Achievement: 05/15/17 Potential to Achieve Goals: Good    Frequency Min 3X/week   Barriers to discharge        Co-evaluation               AM-PAC PT "6 Clicks" Daily Activity  Outcome Measure Difficulty turning over in bed (including adjusting bedclothes, sheets and blankets)?: Unable Difficulty moving from lying on back to sitting on the side of the bed? : Unable Difficulty sitting down on and standing up from a chair with arms (e.g., wheelchair, bedside commode, etc,.)?: Unable Help needed moving to and from a bed to chair (including a wheelchair)?: A Lot Help needed walking in hospital room?: Total Help needed climbing 3-5 steps with a railing? 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

## 2017-05-01 NOTE — Progress Notes (Signed)
Inpatient Diabetes Program Recommendations  AACE/ADA: New Consensus Statement on Inpatient Glycemic Control (2015)  Target Ranges:  Prepandial:   less than 140 mg/dL      Peak postprandial:   less than 180 mg/dL (1-2 hours)      Critically ill patients:  140 - 180 mg/dL   Spoke with patient about diabetes diagnosis and current A1c 7.3%. Patient did not open eyes and seemed lethargic during brief encounter. I asked patient if I could call his sister and talk to her about his diabetes diagnosis since they live together and she fixes a lot of the meals. Patient gave permission to speak with sister.   Called patient's sister with no answer. Left a message to call me back.   Thanks,  Christena DeemShannon Herson Prichard RN, MSN, [facility] Inpatient Diabetes Coordinator Team Pager [phone] (8a-5p)

## 2017-05-01 NOTE — Discharge Instructions (Signed)
Information on my medicine - XARELTO (rivaroxaban)  This medication education was reviewed with me or my healthcare representative as part of my discharge preparation.    WHY WAS XARELTO PRESCRIBED FOR YOU? Xarelto was prescribed to treat blood clots that may have been found in the veins of your legs (deep vein thrombosis) or in your lungs (pulmonary embolism) and to reduce the risk of them occurring again.  What do you need to know about Xarelto? The starting dose is one 15 mg tablet taken TWICE daily with food for the FIRST 21 DAYS then on (enter date)  05/22/17  the dose is changed to one 20 mg tablet taken ONCE A DAY with your evening meal.  DO NOT stop taking Xarelto without talking to the health care provider who prescribed the medication.  Refill your prescription for 20 mg tablets before you run out.  After discharge, you should have regular check-up appointments with your healthcare provider that is prescribing your Xarelto.  In the future your dose may need to be changed if your kidney function changes by a significant amount.  What do you do if you miss a dose? If you are taking Xarelto TWICE DAILY and you miss a dose, take it as soon as you remember. You may take two 15 mg tablets (total 30 mg) at the same time then resume your regularly scheduled 15 mg twice daily the next day.  If you are taking Xarelto ONCE DAILY and you miss a dose, take it as soon as you remember on the same day then continue your regularly scheduled once daily regimen the next day. Do not take two doses of Xarelto at the same time.   Important Safety Information Xarelto is a blood thinner medicine that can cause bleeding. You should call your healthcare provider right away if you experience any of the following: ? Bleeding from an injury or your nose that does not stop. ? Unusual colored urine (red or dark brown) or unusual colored stools (red or black). ? Unusual bruising for unknown reasons. ? A  serious fall or if you hit your head (even if there is no bleeding).  Some medicines may interact with Xarelto and might increase your risk of bleeding while on Xarelto. To help avoid this, consult your healthcare provider or pharmacist prior to using any new prescription or non-prescription medications, including herbals, vitamins, non-steroidal anti-inflammatory drugs (NSAIDs) and supplements.  This website has more information on Xarelto: [email or web address].xarelto.[email or web address].

## 2017-05-06 ENCOUNTER — Ambulatory Visit: Payer: Medicare Other | Admitting: Podiatry

## 2017-05-20 ENCOUNTER — Ambulatory Visit (INDEPENDENT_AMBULATORY_CARE_PROVIDER_SITE_OTHER): Payer: Medicare Other | Admitting: Podiatry

## 2017-05-20 DIAGNOSIS — M79671 Pain in right foot: Secondary | ICD-10-CM

## 2017-05-20 DIAGNOSIS — R600 Localized edema: Secondary | ICD-10-CM | POA: Diagnosis not present

## 2017-05-20 DIAGNOSIS — B351 Tinea unguium: Secondary | ICD-10-CM

## 2017-05-20 DIAGNOSIS — M79672 Pain in left foot: Secondary | ICD-10-CM | POA: Diagnosis not present

## 2017-05-20 NOTE — Patient Instructions (Signed)
Seen for pain in left foot and hypertrophic nails. Noted of excess edema left lower limb. Pain may be associated with swollen foot.  Possible options, compression socks, elevation, physical therapy, fluid pills reviewed to decrease swelling. All nails debrided. Return in 6 months or as needed.

## 2017-05-20 NOTE — Progress Notes (Signed)
Subjective: 72 y.o. year old male patient presents via wheel chair complaining of painful nails. Stated that his mental issue is bothering him and his left foot hurts if attempt to place weight in it.  Wearing old compression socks and AFO on left lower limb.  Patient was accompanied by his sister. Been using Tinactin foot powder that has helped with fungal skin problem.  HPI: Positive history of Stroke in August 2017. Weak on left side and on brace in left lower limb. Pinch nerve in back duration of 4-5 month.  Objective: Dermatologic: Thick yellow deformed nails x 10. Vascular: Pedal pulses are not palpable. Positive of bilateral pedal edema. Orthopedic: Stiffness of ankle joint bilateral. Neurologic: Compromised and loss of protective sensation left side.  Assessment: Dystrophic mycotic nails x 10. Severe lower limb edema left side. Severe foot pain left.   Treatment: All mycotic nails debrided.  Reviewed various options, continue with fluid pill, exercise, and compression socks to decrease edema on left side. May return in 6 months as per request.

## 2017-05-21 ENCOUNTER — Encounter: Payer: Self-pay | Admitting: Podiatry

## 2017-08-14 ENCOUNTER — Other Ambulatory Visit: Payer: Self-pay | Admitting: Podiatry

## 2017-09-30 ENCOUNTER — Other Ambulatory Visit: Payer: Self-pay | Admitting: Podiatry

## 2017-11-08 ENCOUNTER — Other Ambulatory Visit: Payer: Self-pay | Admitting: Podiatry

## 2017-11-17 ENCOUNTER — Ambulatory Visit: Payer: Medicare Other | Admitting: Podiatry

## 2017-11-24 ENCOUNTER — Ambulatory Visit (INDEPENDENT_AMBULATORY_CARE_PROVIDER_SITE_OTHER): Payer: Medicare Other | Admitting: Podiatry

## 2017-11-24 DIAGNOSIS — M79672 Pain in left foot: Secondary | ICD-10-CM

## 2017-11-24 DIAGNOSIS — M79671 Pain in right foot: Secondary | ICD-10-CM | POA: Diagnosis not present

## 2017-11-24 DIAGNOSIS — B351 Tinea unguium: Secondary | ICD-10-CM | POA: Diagnosis not present

## 2017-11-24 NOTE — Progress Notes (Signed)
Subjective: 72 y.o. year old male patient presents via wheel chair accompanied by his sister for painful nails. Patient is wearing compression hoses on both lower limbs.  HPI: Positive history of Stroke in August 2017. Weak on left sideand on brace in left lower limb. Pinch nerve in back duration of 4-5 month.  Objective: Dermatologic: Thick yellow deformed nails x 10. Vascular: Pedal pulses are not palpable on both feet. Positive of 2+ pitting edema left lower limb. Orthopedic: Stiffness on ankle joint bilateral. Neurologic: Loss of protective sensory perception left side.  Assessment: Dystrophic mycotic nails x 10. Lower limb edema left side.  Treatment: All mycotic nails debrided.  Return in 6 months or sooner if needed.

## 2017-11-24 NOTE — Patient Instructions (Signed)
Seen for hypertrophic nails. All nails debrided. Return in 3 months or as needed.  

## 2017-11-25 ENCOUNTER — Encounter: Payer: Self-pay | Admitting: Podiatry

## 2018-02-25 ENCOUNTER — Ambulatory Visit: Payer: Medicare Other | Admitting: Podiatry

## 2018-03-03 ENCOUNTER — Encounter: Payer: Self-pay | Admitting: Podiatry

## 2018-03-03 ENCOUNTER — Ambulatory Visit (INDEPENDENT_AMBULATORY_CARE_PROVIDER_SITE_OTHER): Payer: Medicare Other | Admitting: Podiatry

## 2018-03-03 DIAGNOSIS — M79671 Pain in right foot: Secondary | ICD-10-CM | POA: Diagnosis not present

## 2018-03-03 DIAGNOSIS — M79672 Pain in left foot: Secondary | ICD-10-CM

## 2018-03-03 DIAGNOSIS — B351 Tinea unguium: Secondary | ICD-10-CM | POA: Diagnosis not present

## 2018-03-03 NOTE — Progress Notes (Signed)
Subjective: 72 y.o. year old male patient presents via wheel chair accompanied by his sister for painful nails.  Patient is wearing compression hoses on both lower limbs.  HPI: Positive history of Stroke in August 2017. Weak on left sideand on brace in left lower limb. Pinch nerve in back duration of 4-5 month.  Objective: Dermatologic: Thick yellow deformed nails x 10. Vascular: Pedal pulses are not palpable on both feet. Positive of 2+ pitting edema left lower limb. Orthopedic: Stiffness on ankle joint bilateral. Neurologic: Loss of protective sensory perception left side.  Assessment: Dystrophic mycotic nails x 10. Lower limb edema left side.  Treatment: All mycotic nails debrided.  Advised to seek another podiatrist office for the continuation of foot care.

## 2018-03-03 NOTE — Patient Instructions (Signed)
Seen for hypertrophic nails. All nails debrided. Advised to seek another podiatrist for continuation of foot care.

## 2018-08-28 ENCOUNTER — Other Ambulatory Visit (HOSPITAL_COMMUNITY): Payer: Self-pay | Admitting: Family

## 2019-02-02 IMAGING — CT CT ANGIO CHEST
2 of 8 series · 18 of 36 positions shown · IV contrast (iopamidol)
Comparison: Chest radiograph earlier today.

CLINICAL DATA: Shortness of breath. Flu-like symptoms for 3 days.
Positive D-dimer. Unable to lift arm.

EXAM:
CT ANGIOGRAPHY CHEST WITH CONTRAST
TECHNIQUE: Multidetector CT imaging of the chest was performed using the
standard protocol during bolus administration of intravenous
contrast. Multiplanar CT image reconstructions and MIPs were
obtained to evaluate the vascular anatomy.
CONTRAST:  100mL R6ZBVP-YUR IOPAMIDOL (R6ZBVP-YUR) INJECTION 76%

[Series 6: pe coronal mpr · coronal · 0.55mm/px · 1 of 146 slices shown]
[im 73/146  mediastinal]
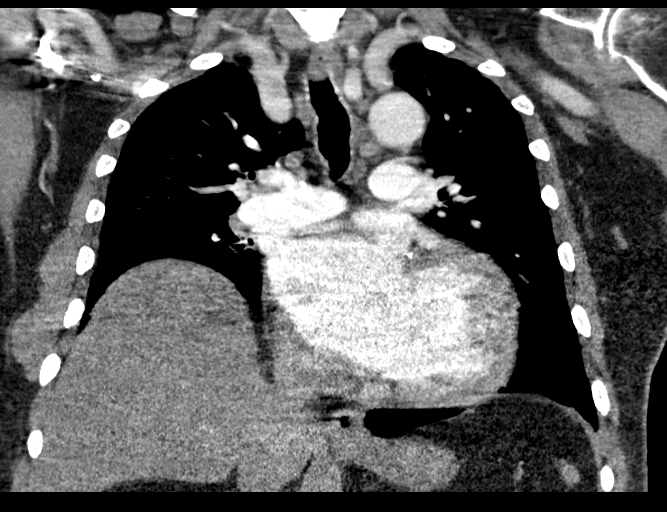

[Series 10: pe thins · axial · 0.75mm/px · z∈[-270,-32]mm · 17 of 266 slices shown]
[im 14/266  lung]
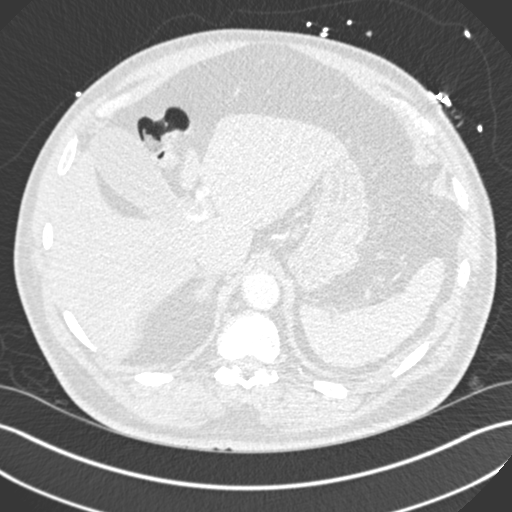
[im 28/266  mediastinal]
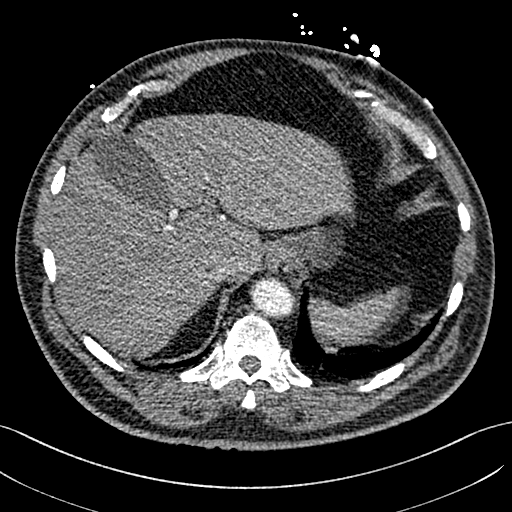
[im 42/266  lung]
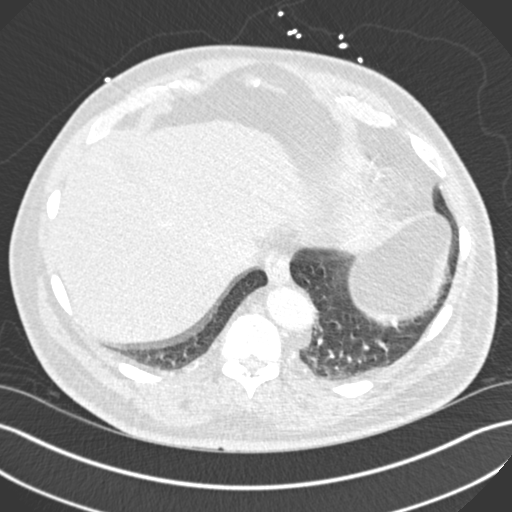
[im 56/266  mediastinal]
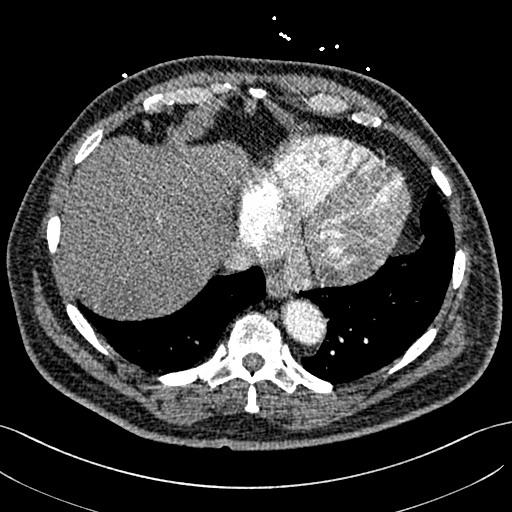
[im 70/266  lung]
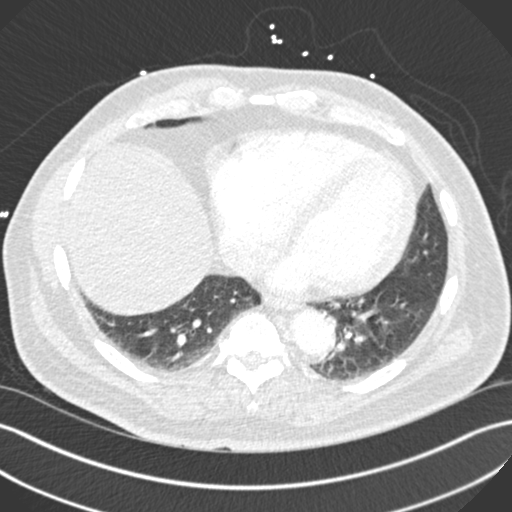
[im 84/266  mediastinal]
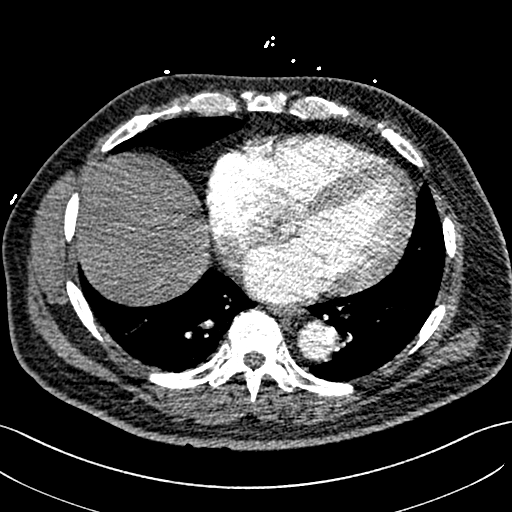
[im 98/266  lung]
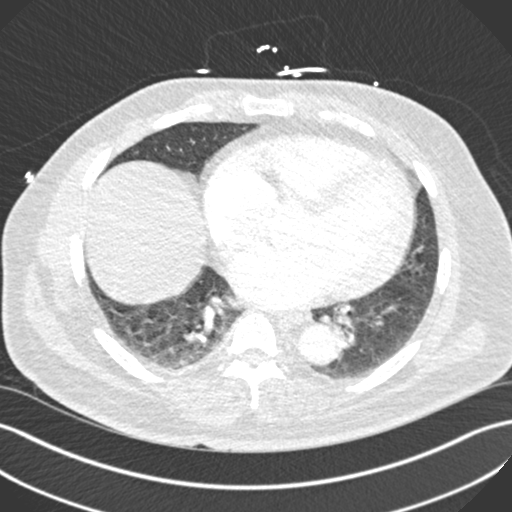
[im 112/266  mediastinal]
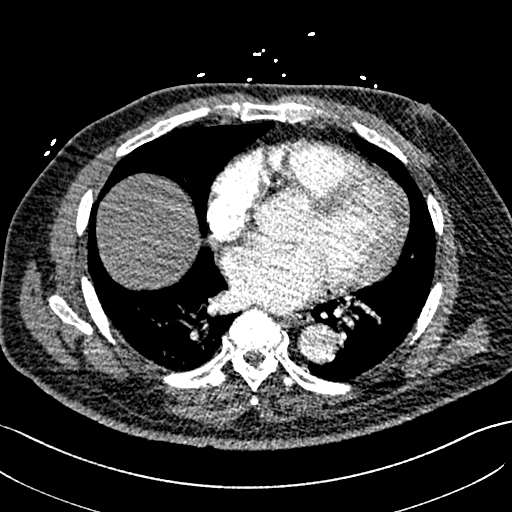
[im 140/266  lung]
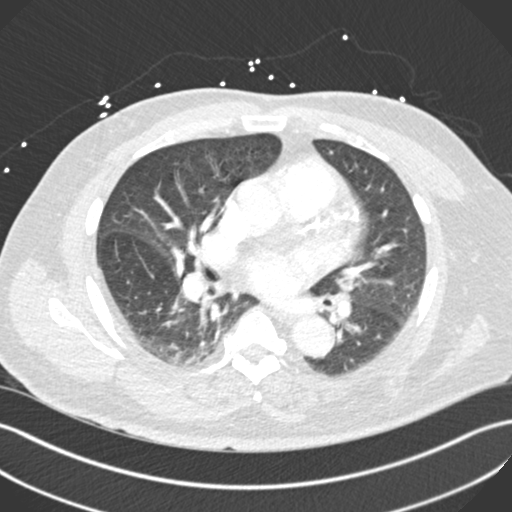
[im 154/266  mediastinal]
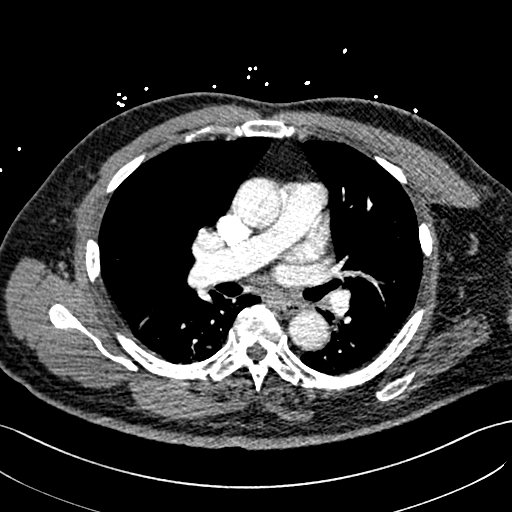
[im 168/266  lung]
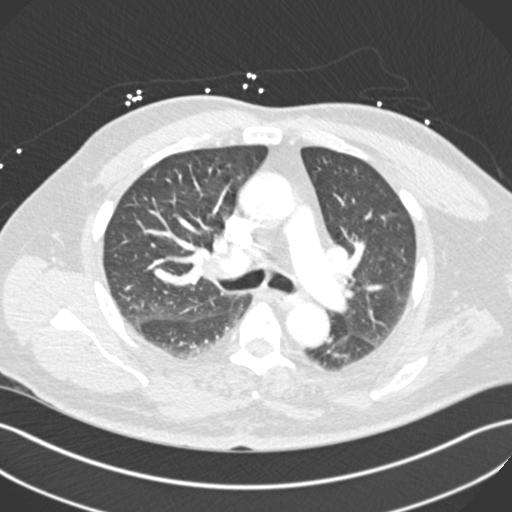
[im 182/266  mediastinal]
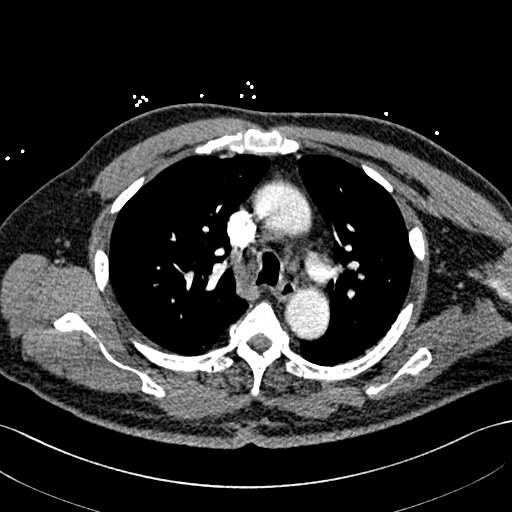
[im 196/266  lung]
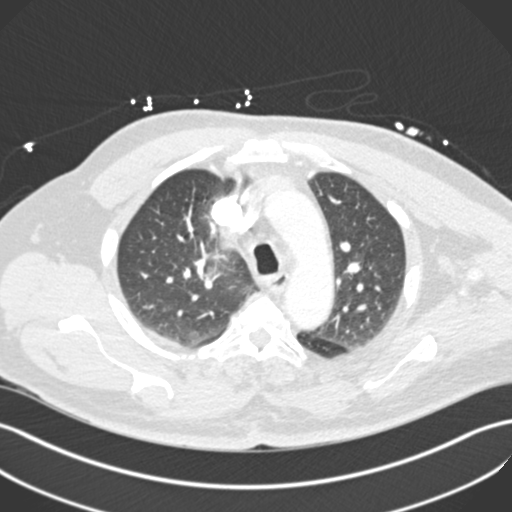
[im 210/266  mediastinal]
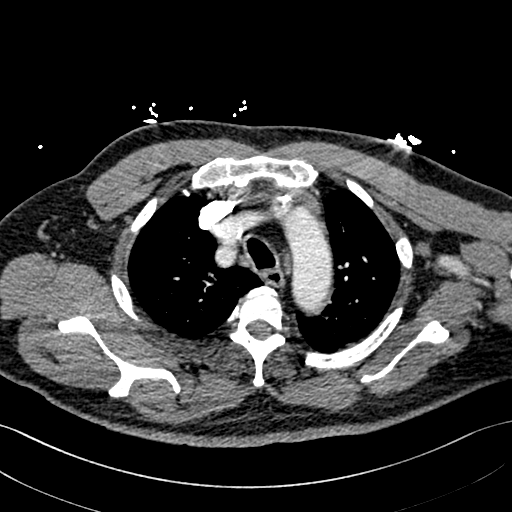
[im 224/266  lung]
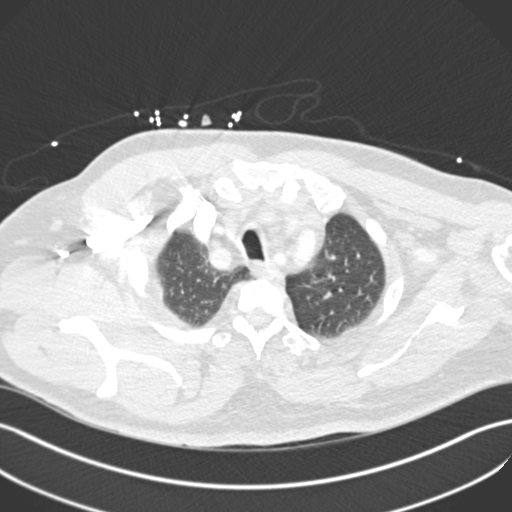
[im 238/266  mediastinal]
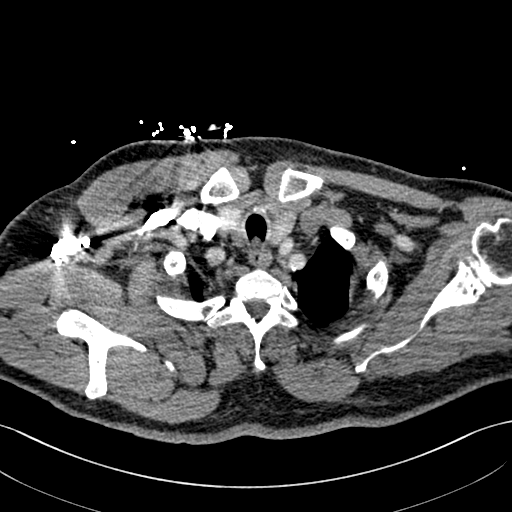
[im 252/266  lung]
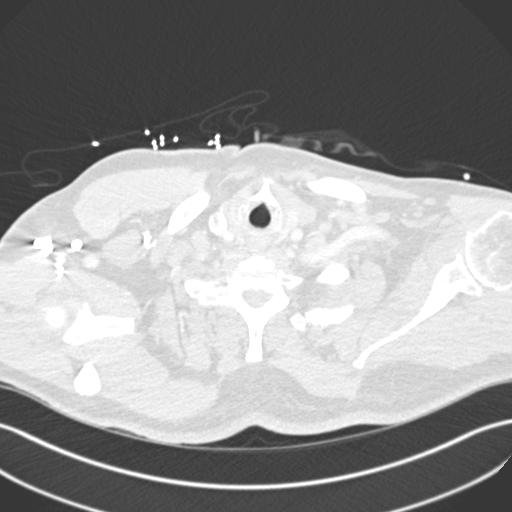

[18 of 36 positions shown; findings below may reference images not displayed]

FINDINGS: Cardiovascular: Cardiomegaly. Three-vessel coronary artery
calcification. Calcification of the thoracic aorta and its major
branches, without visible dissection.

There is satisfactory opacification of the pulmonary arteries to the
segmental level. There is no central or proximal pulmonary embolus.
Visualization of the subsegmental pulmonary arterial branches is
less optimal due to respiratory motion and positioning. However
there is concern for a RIGHT posterior segment lower lobe
subsegmental pulmonary embolus as seen on series 10, image 143.

Mediastinum/Nodes: No enlarged mediastinal, hilar, or axillary lymph
nodes. Thyroid gland, trachea, and esophagus demonstrate no
significant findings.

Lungs/Pleura: Lungs are clear. No pleural effusion or pneumothorax.

Upper Abdomen: RIGHT hemidiaphragm elevation.

Musculoskeletal: No chest wall abnormality. No acute or significant
osseous findings.

Review of the MIP images confirms the above findings.
IMPRESSION: No central or segmental pulmonary embolus. However, there is concern
for a RIGHT posterior segment, lower lobe subsegmental pulmonary
embolus. See discussion above.

Cardiomegaly with 3 vessel coronary artery calcification.

Aortic Atherosclerosis ([id number]).

## 2020-08-17 DEATH — deceased
